# Patient Record
Sex: Female | Born: 1935 | Race: White | Hispanic: No | Marital: Married
Health system: Southern US, Community
[De-identification: ages and names within clinical notes are randomized; demographics above are authoritative.]

---

## 2004-04-18 ENCOUNTER — Ambulatory Visit: Payer: Self-pay | Admitting: Internal Medicine

## 2004-07-22 ENCOUNTER — Ambulatory Visit: Payer: Self-pay | Admitting: Ophthalmology

## 2004-07-29 ENCOUNTER — Ambulatory Visit: Payer: Self-pay | Admitting: Ophthalmology

## 2007-02-26 ENCOUNTER — Inpatient Hospital Stay: Payer: Self-pay | Admitting: Orthopaedic Surgery

## 2007-02-26 ENCOUNTER — Other Ambulatory Visit: Payer: Self-pay

## 2008-03-03 ENCOUNTER — Other Ambulatory Visit: Payer: Self-pay

## 2008-03-03 ENCOUNTER — Inpatient Hospital Stay: Payer: Self-pay | Admitting: Internal Medicine

## 2010-04-17 ENCOUNTER — Inpatient Hospital Stay: Payer: Self-pay | Admitting: Internal Medicine

## 2010-07-24 ENCOUNTER — Ambulatory Visit: Payer: Self-pay | Admitting: Internal Medicine

## 2010-07-30 ENCOUNTER — Emergency Department: Payer: Self-pay | Admitting: Emergency Medicine

## 2011-01-06 ENCOUNTER — Inpatient Hospital Stay: Payer: Self-pay | Admitting: Internal Medicine

## 2011-07-19 ENCOUNTER — Inpatient Hospital Stay: Payer: Self-pay | Admitting: Internal Medicine

## 2011-07-19 LAB — CK TOTAL AND CKMB (NOT AT ARMC)
CK, Total: 52 U/L (ref 21–215)
CK-MB: 1.8 ng/mL (ref 0.5–3.6)
CK-MB: 2.1 ng/mL (ref 0.5–3.6)

## 2011-07-19 LAB — COMPREHENSIVE METABOLIC PANEL
Anion Gap: 11 (ref 7–16)
BUN: 19 mg/dL — ABNORMAL HIGH (ref 7–18)
Calcium, Total: 10 mg/dL (ref 8.5–10.1)
Chloride: 100 mmol/L (ref 98–107)
Co2: 26 mmol/L (ref 21–32)
EGFR (African American): 48 — ABNORMAL LOW
EGFR (Non-African Amer.): 40 — ABNORMAL LOW
Potassium: 4.1 mmol/L (ref 3.5–5.1)
SGOT(AST): 25 U/L (ref 15–37)
SGPT (ALT): 24 U/L
Total Protein: 8 g/dL (ref 6.4–8.2)

## 2011-07-19 LAB — URINALYSIS, COMPLETE
Bacteria: NONE SEEN
Bilirubin,UR: NEGATIVE
Glucose,UR: 150 mg/dL (ref 0–75)
Leukocyte Esterase: NEGATIVE
RBC,UR: 46 /HPF (ref 0–5)
Specific Gravity: 1.016 (ref 1.003–1.030)
Squamous Epithelial: NONE SEEN
WBC UR: 1 /HPF (ref 0–5)

## 2011-07-19 LAB — CBC
HGB: 14 g/dL (ref 12.0–16.0)
MCH: 31.4 pg (ref 26.0–34.0)
MCV: 93 fL (ref 80–100)
Platelet: 305 10*3/uL (ref 150–440)
RBC: 4.47 10*6/uL (ref 3.80–5.20)
WBC: 19.9 10*3/uL — ABNORMAL HIGH (ref 3.6–11.0)

## 2011-07-19 LAB — BILIRUBIN, DIRECT: Bilirubin, Direct: 0.2 mg/dL (ref 0.00–0.20)

## 2011-07-20 LAB — CBC WITH DIFFERENTIAL/PLATELET
Basophil #: 0 10*3/uL (ref 0.0–0.1)
Basophil %: 0.1 %
Eosinophil #: 0 10*3/uL (ref 0.0–0.7)
Eosinophil %: 0 %
HCT: 41.4 % (ref 35.0–47.0)
HGB: 13.9 g/dL (ref 12.0–16.0)
Lymphocyte #: 1.6 10*3/uL (ref 1.0–3.6)
MCH: 31.3 pg (ref 26.0–34.0)
MCV: 93 fL (ref 80–100)
Monocyte #: 0.3 10*3/uL (ref 0.0–0.7)
Neutrophil #: 29.9 10*3/uL — ABNORMAL HIGH (ref 1.4–6.5)
Neutrophil %: 94.1 %
RBC: 4.45 10*6/uL (ref 3.80–5.20)
WBC: 31.8 10*3/uL — ABNORMAL HIGH (ref 3.6–11.0)

## 2011-07-20 LAB — TROPONIN I: Troponin-I: 0.06 ng/mL — ABNORMAL HIGH

## 2011-07-20 LAB — BASIC METABOLIC PANEL
Anion Gap: 10 (ref 7–16)
Calcium, Total: 9.3 mg/dL (ref 8.5–10.1)
Chloride: 101 mmol/L (ref 98–107)
Co2: 26 mmol/L (ref 21–32)
Creatinine: 1.28 mg/dL (ref 0.60–1.30)
Osmolality: 280 (ref 275–301)

## 2011-07-20 LAB — HEPATIC FUNCTION PANEL A (ARMC)
Albumin: 3.3 g/dL — ABNORMAL LOW (ref 3.4–5.0)
Alkaline Phosphatase: 64 U/L (ref 50–136)
Bilirubin,Total: 2.4 mg/dL — ABNORMAL HIGH (ref 0.2–1.0)
SGOT(AST): 31 U/L (ref 15–37)
Total Protein: 6.4 g/dL (ref 6.4–8.2)

## 2011-07-20 LAB — CK-MB: CK-MB: 1.6 ng/mL (ref 0.5–3.6)

## 2011-07-20 LAB — PROTIME-INR: Prothrombin Time: 17 secs — ABNORMAL HIGH (ref 11.5–14.7)

## 2011-07-21 LAB — CBC WITH DIFFERENTIAL/PLATELET
Basophil #: 0 10*3/uL (ref 0.0–0.1)
Eosinophil #: 0 10*3/uL (ref 0.0–0.7)
Lymphocyte #: 2 10*3/uL (ref 1.0–3.6)
MCH: 31.2 pg (ref 26.0–34.0)
MCHC: 32.9 g/dL (ref 32.0–36.0)
MCV: 95 fL (ref 80–100)
Monocyte #: 1.4 10*3/uL — ABNORMAL HIGH (ref 0.0–0.7)
Platelet: 212 10*3/uL (ref 150–440)
RDW: 14 % (ref 11.5–14.5)

## 2011-07-21 LAB — COMPREHENSIVE METABOLIC PANEL
Albumin: 2.5 g/dL — ABNORMAL LOW (ref 3.4–5.0)
Alkaline Phosphatase: 57 U/L (ref 50–136)
BUN: 24 mg/dL — ABNORMAL HIGH (ref 7–18)
Bilirubin,Total: 2 mg/dL — ABNORMAL HIGH (ref 0.2–1.0)
Calcium, Total: 8.7 mg/dL (ref 8.5–10.1)
Creatinine: 1.27 mg/dL (ref 0.60–1.30)
EGFR (Non-African Amer.): 42 — ABNORMAL LOW
Glucose: 108 mg/dL — ABNORMAL HIGH (ref 65–99)
Osmolality: 288 (ref 275–301)
Potassium: 3.4 mmol/L — ABNORMAL LOW (ref 3.5–5.1)
Sodium: 142 mmol/L (ref 136–145)
Total Protein: 6.1 g/dL — ABNORMAL LOW (ref 6.4–8.2)

## 2011-07-21 LAB — APTT: Activated PTT: 56.3 secs — ABNORMAL HIGH (ref 23.6–35.9)

## 2011-07-22 LAB — CBC WITH DIFFERENTIAL/PLATELET
Basophil #: 0 10*3/uL (ref 0.0–0.1)
Eosinophil #: 0.2 10*3/uL (ref 0.0–0.7)
HGB: 10.2 g/dL — ABNORMAL LOW (ref 12.0–16.0)
Lymphocyte #: 2.9 10*3/uL (ref 1.0–3.6)
Lymphocyte %: 20.2 %
MCH: 30.9 pg (ref 26.0–34.0)
MCHC: 32.6 g/dL (ref 32.0–36.0)
Neutrophil %: 71.4 %
Platelet: 215 10*3/uL (ref 150–440)
RDW: 14 % (ref 11.5–14.5)

## 2011-07-22 LAB — BASIC METABOLIC PANEL
Anion Gap: 11 (ref 7–16)
BUN: 23 mg/dL — ABNORMAL HIGH (ref 7–18)
Calcium, Total: 8.3 mg/dL — ABNORMAL LOW (ref 8.5–10.1)
Co2: 26 mmol/L (ref 21–32)
EGFR (African American): 53 — ABNORMAL LOW
EGFR (Non-African Amer.): 43 — ABNORMAL LOW
Glucose: 89 mg/dL (ref 65–99)
Osmolality: 292 (ref 275–301)
Sodium: 145 mmol/L (ref 136–145)

## 2011-07-22 LAB — HEPATIC FUNCTION PANEL A (ARMC)
Albumin: 2.2 g/dL — ABNORMAL LOW (ref 3.4–5.0)
Alkaline Phosphatase: 94 U/L (ref 50–136)
Bilirubin, Direct: 0.7 mg/dL — ABNORMAL HIGH (ref 0.00–0.20)
SGOT(AST): 91 U/L — ABNORMAL HIGH (ref 15–37)
SGPT (ALT): 73 U/L

## 2011-07-22 LAB — PATHOLOGY REPORT

## 2011-07-23 LAB — CBC WITH DIFFERENTIAL/PLATELET
Basophil #: 0 10*3/uL (ref 0.0–0.1)
Eosinophil #: 0.2 10*3/uL (ref 0.0–0.7)
Eosinophil %: 2.1 %
Lymphocyte %: 19.2 %
MCH: 31.1 pg (ref 26.0–34.0)
MCHC: 32.6 g/dL (ref 32.0–36.0)
MCV: 95 fL (ref 80–100)
Monocyte #: 1 10*3/uL — ABNORMAL HIGH (ref 0.0–0.7)
Neutrophil %: 69.3 %
Platelet: 250 10*3/uL (ref 150–440)
RBC: 3.27 10*6/uL — ABNORMAL LOW (ref 3.80–5.20)

## 2011-07-23 LAB — BASIC METABOLIC PANEL
Anion Gap: 8 (ref 7–16)
Chloride: 105 mmol/L (ref 98–107)
EGFR (African American): 49 — ABNORMAL LOW
EGFR (Non-African Amer.): 41 — ABNORMAL LOW
Glucose: 107 mg/dL — ABNORMAL HIGH (ref 65–99)
Osmolality: 284 (ref 275–301)
Potassium: 3.5 mmol/L (ref 3.5–5.1)

## 2011-07-23 LAB — HEPATIC FUNCTION PANEL A (ARMC)
Alkaline Phosphatase: 121 U/L (ref 50–136)
Bilirubin,Total: 1.2 mg/dL — ABNORMAL HIGH (ref 0.2–1.0)
SGOT(AST): 45 U/L — ABNORMAL HIGH (ref 15–37)
SGPT (ALT): 62 U/L

## 2011-07-23 LAB — VANCOMYCIN, TROUGH: Vancomycin, Trough: 15 ug/mL (ref 10–20)

## 2011-07-23 LAB — CULTURE, BLOOD (SINGLE)

## 2011-07-24 LAB — BASIC METABOLIC PANEL
Anion Gap: 12 (ref 7–16)
BUN: 14 mg/dL (ref 7–18)
Chloride: 106 mmol/L (ref 98–107)
Co2: 26 mmol/L (ref 21–32)
Creatinine: 1.09 mg/dL (ref 0.60–1.30)
EGFR (African American): >60
Glucose: 129 mg/dL — ABNORMAL HIGH (ref 65–99)
Osmolality: 289 (ref 275–301)

## 2011-07-24 LAB — CBC WITH DIFFERENTIAL/PLATELET
Basophil %: 0.1 %
HCT: 30.7 % — ABNORMAL LOW (ref 35.0–47.0)
HGB: 10.1 g/dL — ABNORMAL LOW (ref 12.0–16.0)
Lymphocyte %: 17.1 %
MCHC: 32.8 g/dL (ref 32.0–36.0)
Monocyte %: 10.9 %
Neutrophil #: 8 10*3/uL — ABNORMAL HIGH (ref 1.4–6.5)
RBC: 3.26 10*6/uL — ABNORMAL LOW (ref 3.80–5.20)
WBC: 11.1 10*3/uL — ABNORMAL HIGH (ref 3.6–11.0)

## 2011-07-24 LAB — HEPATIC FUNCTION PANEL A (ARMC)
Albumin: 2.7 g/dL — ABNORMAL LOW (ref 3.4–5.0)
Alkaline Phosphatase: 121 U/L (ref 50–136)
Bilirubin, Direct: 0.3 mg/dL — ABNORMAL HIGH (ref 0.00–0.20)
Bilirubin,Total: 1.1 mg/dL — ABNORMAL HIGH (ref 0.2–1.0)
SGOT(AST): 37 U/L (ref 15–37)
SGPT (ALT): 53 U/L

## 2011-07-25 LAB — CULTURE, BLOOD (SINGLE)

## 2011-10-17 LAB — COMPREHENSIVE METABOLIC PANEL
Alkaline Phosphatase: 82 U/L (ref 50–136)
Anion Gap: 6 — ABNORMAL LOW (ref 7–16)
BUN: 20 mg/dL — ABNORMAL HIGH (ref 7–18)
Bilirubin,Total: 0.6 mg/dL (ref 0.2–1.0)
Calcium, Total: 9.1 mg/dL (ref 8.5–10.1)
Chloride: 109 mmol/L — ABNORMAL HIGH (ref 98–107)
Co2: 26 mmol/L (ref 21–32)
EGFR (African American): 59 — ABNORMAL LOW
EGFR (Non-African Amer.): 51 — ABNORMAL LOW
Potassium: 4.5 mmol/L (ref 3.5–5.1)
SGOT(AST): 32 U/L (ref 15–37)
SGPT (ALT): 24 U/L
Sodium: 141 mmol/L (ref 136–145)
Total Protein: 7.2 g/dL (ref 6.4–8.2)

## 2011-10-17 LAB — CBC
HGB: 12.3 g/dL (ref 12.0–16.0)
MCH: 29 pg (ref 26.0–34.0)
MCHC: 31.4 g/dL — ABNORMAL LOW (ref 32.0–36.0)
RDW: 15.1 % — ABNORMAL HIGH (ref 11.5–14.5)

## 2011-10-18 ENCOUNTER — Inpatient Hospital Stay: Payer: Self-pay | Admitting: Orthopedic Surgery

## 2011-10-18 ENCOUNTER — Ambulatory Visit: Payer: Self-pay | Admitting: Orthopaedic Surgery

## 2011-10-18 LAB — URINALYSIS, COMPLETE
Blood: NEGATIVE
Hyaline Cast: 1
Ketone: NEGATIVE
Ph: 5 (ref 4.5–8.0)
Protein: NEGATIVE
Squamous Epithelial: <1

## 2011-10-18 LAB — APTT: Activated PTT: 123.3 secs — ABNORMAL HIGH (ref 23.6–35.9)

## 2011-10-18 LAB — PROTIME-INR: Prothrombin Time: 22.6 secs — ABNORMAL HIGH (ref 11.5–14.7)

## 2011-10-19 LAB — CBC WITH DIFFERENTIAL/PLATELET
Basophil #: 0.1 10*3/uL (ref 0.0–0.1)
Basophil %: 0.4 %
Eosinophil #: 0.4 10*3/uL (ref 0.0–0.7)
HCT: 29.3 % — ABNORMAL LOW (ref 35.0–47.0)
Lymphocyte #: 2.6 10*3/uL (ref 1.0–3.6)
Lymphocyte %: 14.7 %
MCH: 30 pg (ref 26.0–34.0)
MCHC: 32.3 g/dL (ref 32.0–36.0)
MCV: 93 fL (ref 80–100)
Monocyte #: 1.3 x10 3/mm — ABNORMAL HIGH (ref 0.2–0.9)
Neutrophil #: 13.1 10*3/uL — ABNORMAL HIGH (ref 1.4–6.5)
RDW: 14.9 % — ABNORMAL HIGH (ref 11.5–14.5)
WBC: 17.4 10*3/uL — ABNORMAL HIGH (ref 3.6–11.0)

## 2011-10-19 LAB — BASIC METABOLIC PANEL
Calcium, Total: 8.4 mg/dL — ABNORMAL LOW (ref 8.5–10.1)
Chloride: 105 mmol/L (ref 98–107)
Co2: 24 mmol/L (ref 21–32)
EGFR (African American): 38 — ABNORMAL LOW
EGFR (Non-African Amer.): 33 — ABNORMAL LOW
Osmolality: 278 (ref 275–301)

## 2011-10-19 LAB — PROTIME-INR
INR: 1.5
Prothrombin Time: 18.4 secs — ABNORMAL HIGH (ref 11.5–14.7)

## 2011-10-19 LAB — MAGNESIUM: Magnesium: 1.6 mg/dL — ABNORMAL LOW

## 2011-10-19 LAB — APTT: Activated PTT: 68.8 secs — ABNORMAL HIGH (ref 23.6–35.9)

## 2011-10-20 LAB — PROTIME-INR
INR: 1.2
Prothrombin Time: 15.9 secs — ABNORMAL HIGH (ref 11.5–14.7)

## 2011-10-20 LAB — BASIC METABOLIC PANEL
Anion Gap: 11 (ref 7–16)
Chloride: 105 mmol/L (ref 98–107)
Co2: 23 mmol/L (ref 21–32)
Creatinine: 1.11 mg/dL (ref 0.60–1.30)
EGFR (African American): 52 — ABNORMAL LOW
Osmolality: 282 (ref 275–301)
Potassium: 4.3 mmol/L (ref 3.5–5.1)
Sodium: 139 mmol/L (ref 136–145)

## 2011-10-20 LAB — CBC WITH DIFFERENTIAL/PLATELET
Basophil #: 0.1 10*3/uL (ref 0.0–0.1)
Basophil %: 0.4 %
HCT: 26.4 % — ABNORMAL LOW (ref 35.0–47.0)
MCH: 30 pg (ref 26.0–34.0)
MCHC: 32.9 g/dL (ref 32.0–36.0)
Monocyte #: 1.3 x10 3/mm — ABNORMAL HIGH (ref 0.2–0.9)
Monocyte %: 7.8 %

## 2011-10-20 LAB — APTT: Activated PTT: 58.3 secs — ABNORMAL HIGH (ref 23.6–35.9)

## 2011-10-21 LAB — CBC WITH DIFFERENTIAL/PLATELET
Basophil #: 0 10*3/uL (ref 0.0–0.1)
Eosinophil #: 0 10*3/uL (ref 0.0–0.7)
HCT: 26.9 % — ABNORMAL LOW (ref 35.0–47.0)
HGB: 8.9 g/dL — ABNORMAL LOW (ref 12.0–16.0)
Lymphocyte %: 5.4 %
MCHC: 33.1 g/dL (ref 32.0–36.0)
Neutrophil %: 91 %
Platelet: 154 10*3/uL (ref 150–440)
RBC: 2.95 10*6/uL — ABNORMAL LOW (ref 3.80–5.20)
RDW: 14.9 % — ABNORMAL HIGH (ref 11.5–14.5)

## 2011-10-21 LAB — BASIC METABOLIC PANEL
Anion Gap: 8 (ref 7–16)
Calcium, Total: 8.4 mg/dL — ABNORMAL LOW (ref 8.5–10.1)
Chloride: 110 mmol/L — ABNORMAL HIGH (ref 98–107)
Co2: 26 mmol/L (ref 21–32)
Creatinine: 1.02 mg/dL (ref 0.60–1.30)
Osmolality: 294 (ref 275–301)

## 2011-10-21 LAB — PROTIME-INR: INR: 1.1

## 2011-10-22 LAB — CBC WITH DIFFERENTIAL/PLATELET
Basophil #: 0 10*3/uL (ref 0.0–0.1)
Basophil %: 0.1 %
Eosinophil %: 0.1 %
HGB: 8.6 g/dL — ABNORMAL LOW (ref 12.0–16.0)
Lymphocyte #: 1 10*3/uL (ref 1.0–3.6)
Lymphocyte %: 5.5 %
MCHC: 33.1 g/dL (ref 32.0–36.0)
MCV: 90 fL (ref 80–100)
Monocyte %: 3.3 %
Neutrophil #: 17.2 10*3/uL — ABNORMAL HIGH (ref 1.4–6.5)
Platelet: 228 10*3/uL (ref 150–440)
RBC: 2.87 10*6/uL — ABNORMAL LOW (ref 3.80–5.20)
WBC: 18.9 10*3/uL — ABNORMAL HIGH (ref 3.6–11.0)

## 2011-10-22 LAB — BASIC METABOLIC PANEL
Anion Gap: 7 (ref 7–16)
BUN: 42 mg/dL — ABNORMAL HIGH (ref 7–18)
Chloride: 108 mmol/L — ABNORMAL HIGH (ref 98–107)
Co2: 27 mmol/L (ref 21–32)
Creatinine: 1.07 mg/dL (ref 0.60–1.30)
EGFR (African American): 54 — ABNORMAL LOW
EGFR (Non-African Amer.): 47 — ABNORMAL LOW
Glucose: 175 mg/dL — ABNORMAL HIGH (ref 65–99)
Osmolality: 298 (ref 275–301)
Potassium: 3.7 mmol/L (ref 3.5–5.1)
Sodium: 142 mmol/L (ref 136–145)

## 2011-10-22 LAB — PROTIME-INR
INR: 1
Prothrombin Time: 13.2 secs (ref 11.5–14.7)

## 2011-10-22 LAB — APTT: Activated PTT: 43.2 secs — ABNORMAL HIGH (ref 23.6–35.9)

## 2011-10-23 LAB — CBC WITH DIFFERENTIAL/PLATELET
Basophil #: 0 10*3/uL (ref 0.0–0.1)
Basophil %: 0.2 %
Eosinophil #: 0 10*3/uL (ref 0.0–0.7)
HCT: 25.8 % — ABNORMAL LOW (ref 35.0–47.0)
Lymphocyte %: 5.4 %
MCV: 92 fL (ref 80–100)
Neutrophil #: 15.3 10*3/uL — ABNORMAL HIGH (ref 1.4–6.5)
Platelet: 249 10*3/uL (ref 150–440)
RBC: 2.81 10*6/uL — ABNORMAL LOW (ref 3.80–5.20)
WBC: 17 10*3/uL — ABNORMAL HIGH (ref 3.6–11.0)

## 2011-10-23 LAB — BASIC METABOLIC PANEL
Anion Gap: 8 (ref 7–16)
Calcium, Total: 8.2 mg/dL — ABNORMAL LOW (ref 8.5–10.1)
Chloride: 110 mmol/L — ABNORMAL HIGH (ref 98–107)
Creatinine: 0.97 mg/dL (ref 0.60–1.30)
EGFR (African American): >60
EGFR (Non-African Amer.): 53 — ABNORMAL LOW
Glucose: 129 mg/dL — ABNORMAL HIGH (ref 65–99)
Osmolality: 298 (ref 275–301)
Potassium: 4.3 mmol/L (ref 3.5–5.1)

## 2011-10-23 LAB — HEMOGLOBIN: HGB: 7.8 g/dL — ABNORMAL LOW (ref 12.0–16.0)

## 2011-10-23 LAB — PROTIME-INR
INR: 1.1
Prothrombin Time: 14.5 secs (ref 11.5–14.7)

## 2011-10-23 LAB — APTT: Activated PTT: 33.7 secs (ref 23.6–35.9)

## 2011-10-24 LAB — URINALYSIS, COMPLETE
Glucose,UR: 50 mg/dL (ref 0–75)
Ph: 5 (ref 4.5–8.0)
RBC,UR: 1371 /HPF (ref 0–5)
Specific Gravity: 1.026 (ref 1.003–1.030)
Transitional Epi: 2
WBC UR: 365 /HPF (ref 0–5)

## 2011-10-24 LAB — BASIC METABOLIC PANEL
Anion Gap: 5 — ABNORMAL LOW (ref 7–16)
BUN: 28 mg/dL — ABNORMAL HIGH (ref 7–18)
Calcium, Total: 7.3 mg/dL — ABNORMAL LOW (ref 8.5–10.1)
Co2: 26 mmol/L (ref 21–32)
Creatinine: 0.91 mg/dL (ref 0.60–1.30)
EGFR (African American): >60
EGFR (Non-African Amer.): 57 — ABNORMAL LOW
Glucose: 127 mg/dL — ABNORMAL HIGH (ref 65–99)
Osmolality: 294 (ref 275–301)
Potassium: 3.8 mmol/L (ref 3.5–5.1)

## 2011-10-24 LAB — CBC WITH DIFFERENTIAL/PLATELET
Basophil %: 0.1 %
Eosinophil #: 0.1 10*3/uL (ref 0.0–0.7)
Eosinophil %: 0.6 %
HCT: 30.1 % — ABNORMAL LOW (ref 35.0–47.0)
HGB: 9.9 g/dL — ABNORMAL LOW (ref 12.0–16.0)
Lymphocyte #: 1.4 10*3/uL (ref 1.0–3.6)
MCH: 30.3 pg (ref 26.0–34.0)
Platelet: 247 10*3/uL (ref 150–440)
RBC: 3.26 10*6/uL — ABNORMAL LOW (ref 3.80–5.20)
RDW: 15 % — ABNORMAL HIGH (ref 11.5–14.5)
WBC: 12.8 10*3/uL — ABNORMAL HIGH (ref 3.6–11.0)

## 2011-10-25 LAB — CBC WITH DIFFERENTIAL/PLATELET
Eosinophil #: 0.5 10*3/uL (ref 0.0–0.7)
HCT: 29.3 % — ABNORMAL LOW (ref 35.0–47.0)
Lymphocyte #: 1.9 10*3/uL (ref 1.0–3.6)
Lymphocyte %: 11.4 %
MCH: 29.8 pg (ref 26.0–34.0)
MCHC: 31.7 g/dL — ABNORMAL LOW (ref 32.0–36.0)
MCV: 94 fL (ref 80–100)
Monocyte #: 1.1 x10 3/mm — ABNORMAL HIGH (ref 0.2–0.9)
Monocyte %: 6.4 %
Neutrophil #: 13.1 10*3/uL — ABNORMAL HIGH (ref 1.4–6.5)
RBC: 3.11 10*6/uL — ABNORMAL LOW (ref 3.80–5.20)
RDW: 14.6 % — ABNORMAL HIGH (ref 11.5–14.5)
WBC: 16.5 10*3/uL — ABNORMAL HIGH (ref 3.6–11.0)

## 2011-10-26 LAB — CBC WITH DIFFERENTIAL/PLATELET
Eosinophil #: 0.4 10*3/uL (ref 0.0–0.7)
Eosinophil %: 2.5 %
HCT: 29.3 % — ABNORMAL LOW (ref 35.0–47.0)
Lymphocyte #: 1.4 10*3/uL (ref 1.0–3.6)
Neutrophil #: 13.9 10*3/uL — ABNORMAL HIGH (ref 1.4–6.5)
Neutrophil %: 82.9 %
Platelet: 299 10*3/uL (ref 150–440)
RBC: 3.16 10*6/uL — ABNORMAL LOW (ref 3.80–5.20)
RDW: 15.1 % — ABNORMAL HIGH (ref 11.5–14.5)
WBC: 16.8 10*3/uL — ABNORMAL HIGH (ref 3.6–11.0)

## 2011-10-26 LAB — BASIC METABOLIC PANEL
Anion Gap: 7 (ref 7–16)
Chloride: 107 mmol/L (ref 98–107)
Co2: 26 mmol/L (ref 21–32)
Creatinine: 0.92 mg/dL (ref 0.60–1.30)
EGFR (African American): >60
EGFR (Non-African Amer.): 56 — ABNORMAL LOW
Osmolality: 285 (ref 275–301)
Potassium: 4.3 mmol/L (ref 3.5–5.1)
Sodium: 140 mmol/L (ref 136–145)

## 2011-10-27 LAB — BASIC METABOLIC PANEL
BUN: 23 mg/dL — ABNORMAL HIGH (ref 7–18)
Chloride: 108 mmol/L — ABNORMAL HIGH (ref 98–107)
Co2: 24 mmol/L (ref 21–32)
Creatinine: 0.87 mg/dL (ref 0.60–1.30)
EGFR (African American): >60
EGFR (Non-African Amer.): 60 — ABNORMAL LOW
Glucose: 112 mg/dL — ABNORMAL HIGH (ref 65–99)
Osmolality: 286 (ref 275–301)
Potassium: 5.2 mmol/L — ABNORMAL HIGH (ref 3.5–5.1)
Sodium: 141 mmol/L (ref 136–145)

## 2011-10-27 LAB — CBC WITH DIFFERENTIAL/PLATELET
Basophil #: 0.1 10*3/uL (ref 0.0–0.1)
Basophil %: 0.7 %
HCT: 28.9 % — ABNORMAL LOW (ref 35.0–47.0)
HGB: 9.6 g/dL — ABNORMAL LOW (ref 12.0–16.0)
MCH: 31.2 pg (ref 26.0–34.0)
MCV: 94 fL (ref 80–100)
Monocyte #: 1.3 x10 3/mm — ABNORMAL HIGH (ref 0.2–0.9)
Neutrophil #: 13.1 10*3/uL — ABNORMAL HIGH (ref 1.4–6.5)
Platelet: 329 10*3/uL (ref 150–440)
RBC: 3.08 10*6/uL — ABNORMAL LOW (ref 3.80–5.20)
RDW: 15.5 % — ABNORMAL HIGH (ref 11.5–14.5)
WBC: 16.3 10*3/uL — ABNORMAL HIGH (ref 3.6–11.0)

## 2011-10-27 LAB — URINE CULTURE

## 2011-10-28 LAB — PATHOLOGY REPORT

## 2011-10-30 ENCOUNTER — Inpatient Hospital Stay: Payer: Self-pay | Admitting: Internal Medicine

## 2011-10-30 LAB — CBC
HCT: 31 % — ABNORMAL LOW (ref 35.0–47.0)
HGB: 10 g/dL — ABNORMAL LOW (ref 12.0–16.0)
MCH: 30.2 pg (ref 26.0–34.0)
MCHC: 32.3 g/dL (ref 32.0–36.0)
Platelet: 581 10*3/uL — ABNORMAL HIGH (ref 150–440)
RBC: 3.31 10*6/uL — ABNORMAL LOW (ref 3.80–5.20)
RDW: 15.8 % — ABNORMAL HIGH (ref 11.5–14.5)

## 2011-10-30 LAB — URINALYSIS, COMPLETE
Bilirubin,UR: NEGATIVE
Blood: NEGATIVE
Ketone: NEGATIVE
Nitrite: NEGATIVE
Squamous Epithelial: NONE SEEN
WBC UR: <1 /HPF (ref 0–5)

## 2011-10-30 LAB — COMPREHENSIVE METABOLIC PANEL
Alkaline Phosphatase: 175 U/L — ABNORMAL HIGH (ref 50–136)
BUN: 36 mg/dL — ABNORMAL HIGH (ref 7–18)
Co2: 29 mmol/L (ref 21–32)
EGFR (African American): 52 — ABNORMAL LOW
Osmolality: 285 (ref 275–301)
Potassium: 4.3 mmol/L (ref 3.5–5.1)
SGPT (ALT): 38 U/L
Sodium: 138 mmol/L (ref 136–145)
Total Protein: 6.5 g/dL (ref 6.4–8.2)

## 2011-10-30 LAB — TROPONIN I: Troponin-I: <0.02 ng/mL

## 2011-10-31 LAB — BASIC METABOLIC PANEL
Anion Gap: 9 (ref 7–16)
BUN: 31 mg/dL — ABNORMAL HIGH (ref 7–18)
Co2: 29 mmol/L (ref 21–32)
Creatinine: 1.06 mg/dL (ref 0.60–1.30)
EGFR (African American): 55 — ABNORMAL LOW
Osmolality: 285 (ref 275–301)
Potassium: 3.5 mmol/L (ref 3.5–5.1)
Sodium: 140 mmol/L (ref 136–145)

## 2011-10-31 LAB — CBC WITH DIFFERENTIAL/PLATELET
Eosinophil #: 0 10*3/uL (ref 0.0–0.7)
Eosinophil %: 0.1 %
HCT: 31 % — ABNORMAL LOW (ref 35.0–47.0)
HGB: 9.9 g/dL — ABNORMAL LOW (ref 12.0–16.0)
MCHC: 32 g/dL (ref 32.0–36.0)
Neutrophil #: 19.3 10*3/uL — ABNORMAL HIGH (ref 1.4–6.5)
Neutrophil %: 84.5 %
RBC: 3.3 10*6/uL — ABNORMAL LOW (ref 3.80–5.20)
WBC: 22.9 10*3/uL — ABNORMAL HIGH (ref 3.6–11.0)

## 2011-10-31 LAB — MAGNESIUM: Magnesium: 1.9 mg/dL

## 2011-11-01 LAB — CBC WITH DIFFERENTIAL/PLATELET
Basophil %: 0.7 %
Eosinophil #: 0.1 10*3/uL (ref 0.0–0.7)
Eosinophil %: 0.3 %
HGB: 10 g/dL — ABNORMAL LOW (ref 12.0–16.0)
Lymphocyte %: 8.4 %
MCH: 30.1 pg (ref 26.0–34.0)
MCV: 93 fL (ref 80–100)
Monocyte #: 1.2 x10 3/mm — ABNORMAL HIGH (ref 0.2–0.9)
Monocyte %: 5.9 %
Neutrophil #: 16.7 10*3/uL — ABNORMAL HIGH (ref 1.4–6.5)
Neutrophil %: 84.7 %
Platelet: 462 10*3/uL — ABNORMAL HIGH (ref 150–440)
RBC: 3.31 10*6/uL — ABNORMAL LOW (ref 3.80–5.20)

## 2011-11-01 LAB — BASIC METABOLIC PANEL
Anion Gap: 10 (ref 7–16)
Calcium, Total: 8.2 mg/dL — ABNORMAL LOW (ref 8.5–10.1)
Glucose: 118 mg/dL — ABNORMAL HIGH (ref 65–99)
Osmolality: 290 (ref 275–301)
Sodium: 142 mmol/L (ref 136–145)

## 2011-11-02 LAB — CBC WITH DIFFERENTIAL/PLATELET
Basophil %: 0.1 %
Eosinophil #: 0 10*3/uL (ref 0.0–0.7)
HCT: 31.7 % — ABNORMAL LOW (ref 35.0–47.0)
MCH: 30.6 pg (ref 26.0–34.0)
MCHC: 32.5 g/dL (ref 32.0–36.0)
Neutrophil #: 17.6 10*3/uL — ABNORMAL HIGH (ref 1.4–6.5)
Neutrophil %: 92.9 %
Platelet: 436 10*3/uL (ref 150–440)
RBC: 3.36 10*6/uL — ABNORMAL LOW (ref 3.80–5.20)

## 2011-11-02 LAB — BASIC METABOLIC PANEL
Anion Gap: 11 (ref 7–16)
BUN: 33 mg/dL — ABNORMAL HIGH (ref 7–18)
Co2: 28 mmol/L (ref 21–32)
EGFR (African American): >60
EGFR (Non-African Amer.): 55 — ABNORMAL LOW
Glucose: 107 mg/dL — ABNORMAL HIGH (ref 65–99)
Osmolality: 289 (ref 275–301)
Sodium: 141 mmol/L (ref 136–145)

## 2011-11-03 LAB — CBC WITH DIFFERENTIAL/PLATELET
Basophil #: 0.1 10*3/uL (ref 0.0–0.1)
Eosinophil %: 0 %
Lymphocyte #: 0.9 10*3/uL — ABNORMAL LOW (ref 1.0–3.6)
Lymphocyte %: 4.4 %
MCH: 30.6 pg (ref 26.0–34.0)
Monocyte %: 2.6 %
Neutrophil #: 19.3 10*3/uL — ABNORMAL HIGH (ref 1.4–6.5)
Neutrophil %: 92.4 %
RBC: 3.6 10*6/uL — ABNORMAL LOW (ref 3.80–5.20)
RDW: 16.1 % — ABNORMAL HIGH (ref 11.5–14.5)
WBC: 20.9 10*3/uL — ABNORMAL HIGH (ref 3.6–11.0)

## 2011-11-03 LAB — BASIC METABOLIC PANEL
Anion Gap: 9 (ref 7–16)
BUN: 38 mg/dL — ABNORMAL HIGH (ref 7–18)
Calcium, Total: 8.9 mg/dL (ref 8.5–10.1)
Creatinine: 1.09 mg/dL (ref 0.60–1.30)
EGFR (African American): 53 — ABNORMAL LOW
EGFR (Non-African Amer.): 46 — ABNORMAL LOW
Osmolality: 289 (ref 275–301)
Sodium: 139 mmol/L (ref 136–145)

## 2011-11-04 LAB — CBC WITH DIFFERENTIAL/PLATELET
Basophil #: 0 10*3/uL (ref 0.0–0.1)
Eosinophil #: 0 10*3/uL (ref 0.0–0.7)
Eosinophil %: 0 %
Lymphocyte %: 3.8 %
MCH: 30.6 pg (ref 26.0–34.0)
MCHC: 33.1 g/dL (ref 32.0–36.0)
MCV: 92 fL (ref 80–100)
Monocyte #: 0.5 x10 3/mm (ref 0.2–0.9)
Monocyte %: 2.5 %
Neutrophil %: 93.6 %
RBC: 3.75 10*6/uL — ABNORMAL LOW (ref 3.80–5.20)
RDW: 16.1 % — ABNORMAL HIGH (ref 11.5–14.5)
WBC: 19.9 10*3/uL — ABNORMAL HIGH (ref 3.6–11.0)

## 2011-11-04 LAB — BASIC METABOLIC PANEL
Anion Gap: 11 (ref 7–16)
BUN: 43 mg/dL — ABNORMAL HIGH (ref 7–18)
Calcium, Total: 8.5 mg/dL (ref 8.5–10.1)
Co2: 30 mmol/L (ref 21–32)
EGFR (African American): 45 — ABNORMAL LOW
Glucose: 172 mg/dL — ABNORMAL HIGH (ref 65–99)
Potassium: 3.5 mmol/L (ref 3.5–5.1)
Sodium: 137 mmol/L (ref 136–145)

## 2011-11-05 ENCOUNTER — Encounter: Payer: Self-pay | Admitting: Internal Medicine

## 2011-11-05 LAB — BASIC METABOLIC PANEL
Anion Gap: 8 (ref 7–16)
BUN: 40 mg/dL — ABNORMAL HIGH (ref 7–18)
Calcium, Total: 8.5 mg/dL (ref 8.5–10.1)
Chloride: 97 mmol/L — ABNORMAL LOW (ref 98–107)
Co2: 34 mmol/L — ABNORMAL HIGH (ref 21–32)
Creatinine: 1.13 mg/dL (ref 0.60–1.30)
EGFR (African American): 51 — ABNORMAL LOW
EGFR (Non-African Amer.): 44 — ABNORMAL LOW
Glucose: 175 mg/dL — ABNORMAL HIGH (ref 65–99)
Osmolality: 292 (ref 275–301)
Sodium: 139 mmol/L (ref 136–145)

## 2011-11-05 LAB — CBC WITH DIFFERENTIAL/PLATELET
Basophil #: 0 10*3/uL (ref 0.0–0.1)
Eosinophil #: 0 10*3/uL (ref 0.0–0.7)
Eosinophil %: 0 %
MCH: 31.1 pg (ref 26.0–34.0)
MCHC: 33.5 g/dL (ref 32.0–36.0)
MCV: 93 fL (ref 80–100)
Monocyte #: 0.4 x10 3/mm (ref 0.2–0.9)
Neutrophil %: 92.3 %
Platelet: 448 10*3/uL — ABNORMAL HIGH (ref 150–440)
RDW: 16 % — ABNORMAL HIGH (ref 11.5–14.5)

## 2011-11-05 LAB — CULTURE, BLOOD (SINGLE)

## 2011-11-07 LAB — CREATININE, SERUM
EGFR (African American): 57 — ABNORMAL LOW
EGFR (Non-African Amer.): 49 — ABNORMAL LOW

## 2011-11-14 LAB — CBC WITH DIFFERENTIAL/PLATELET
Basophil #: 0 10*3/uL (ref 0.0–0.1)
Basophil %: 0.1 %
Eosinophil %: 1.5 %
Eosinophil %: 0.3 %
HCT: 32 % — ABNORMAL LOW (ref 35.0–47.0)
HGB: 9.7 g/dL — ABNORMAL LOW (ref 12.0–16.0)
Lymphocyte #: 0.6 10*3/uL — ABNORMAL LOW (ref 1.0–3.6)
Lymphocyte %: 5.1 %
Lymphocyte %: 4.1 %
MCH: 31.2 pg (ref 26.0–34.0)
MCV: 97 fL (ref 80–100)
Monocyte #: 0.6 x10 3/mm (ref 0.2–0.9)
Monocyte #: 0.4 x10 3/mm (ref 0.2–0.9)
Monocyte %: 4.7 %
Monocyte %: 2.7 %
Neutrophil #: 10.9 10*3/uL — ABNORMAL HIGH (ref 1.4–6.5)
Neutrophil #: 12.7 10*3/uL — ABNORMAL HIGH (ref 1.4–6.5)
Neutrophil %: 87.9 %
RDW: 17.5 % — ABNORMAL HIGH (ref 11.5–14.5)
RDW: 18.1 % — ABNORMAL HIGH (ref 11.5–14.5)
WBC: 12.4 10*3/uL — ABNORMAL HIGH (ref 3.6–11.0)
WBC: 13.7 10*3/uL — ABNORMAL HIGH (ref 3.6–11.0)

## 2011-11-14 LAB — BASIC METABOLIC PANEL
Anion Gap: 7 (ref 7–16)
BUN: 26 mg/dL — ABNORMAL HIGH (ref 7–18)
Calcium, Total: 7.4 mg/dL — ABNORMAL LOW (ref 8.5–10.1)
Calcium, Total: 8.8 mg/dL (ref 8.5–10.1)
Chloride: 87 mmol/L — ABNORMAL LOW (ref 98–107)
Chloride: 98 mmol/L (ref 98–107)
Co2: 32 mmol/L (ref 21–32)
Creatinine: 1.53 mg/dL — ABNORMAL HIGH (ref 0.60–1.30)
Creatinine: 1.4 mg/dL — ABNORMAL HIGH (ref 0.60–1.30)
EGFR (African American): 39 — ABNORMAL LOW
EGFR (Non-African Amer.): 30 — ABNORMAL LOW
EGFR (Non-African Amer.): 34 — ABNORMAL LOW
Glucose: 727 mg/dL (ref 65–99)
Osmolality: 292 (ref 275–301)
Potassium: 2.7 mmol/L — ABNORMAL LOW (ref 3.5–5.1)
Potassium: 3.1 mmol/L — ABNORMAL LOW (ref 3.5–5.1)
Sodium: 126 mmol/L — ABNORMAL LOW (ref 136–145)
Sodium: 143 mmol/L (ref 136–145)

## 2011-11-15 ENCOUNTER — Encounter: Payer: Self-pay | Admitting: Internal Medicine

## 2011-11-20 LAB — BASIC METABOLIC PANEL
BUN: 17 mg/dL (ref 7–18)
Calcium, Total: 8.6 mg/dL (ref 8.5–10.1)
Chloride: 107 mmol/L (ref 98–107)
Co2: 30 mmol/L (ref 21–32)
Creatinine: 0.88 mg/dL (ref 0.60–1.30)
EGFR (African American): >60
EGFR (Non-African Amer.): 59 — ABNORMAL LOW
Osmolality: 288 (ref 275–301)
Potassium: 3.9 mmol/L (ref 3.5–5.1)
Sodium: 144 mmol/L (ref 136–145)

## 2011-11-20 LAB — CBC WITH DIFFERENTIAL/PLATELET
Basophil %: 1.1 %
Eosinophil %: 3.9 %
HCT: 33.9 % — ABNORMAL LOW (ref 35.0–47.0)
HGB: 11.1 g/dL — ABNORMAL LOW (ref 12.0–16.0)
Lymphocyte #: 1.1 10*3/uL (ref 1.0–3.6)
MCHC: 32.6 g/dL (ref 32.0–36.0)
Monocyte %: 7.7 %
Neutrophil #: 4.5 10*3/uL (ref 1.4–6.5)
Neutrophil %: 69.9 %
Platelet: 111 10*3/uL — ABNORMAL LOW (ref 150–440)
RBC: 3.55 10*6/uL — ABNORMAL LOW (ref 3.80–5.20)
WBC: 6.4 10*3/uL (ref 3.6–11.0)

## 2011-12-03 LAB — URINALYSIS, COMPLETE
Blood: NEGATIVE
Hyaline Cast: 10
Leukocyte Esterase: NEGATIVE
Protein: NEGATIVE
RBC,UR: 1 /HPF (ref 0–5)
Specific Gravity: 1.017 (ref 1.003–1.030)
Squamous Epithelial: NONE SEEN

## 2011-12-05 LAB — URINE CULTURE

## 2011-12-09 ENCOUNTER — Emergency Department: Payer: Self-pay | Admitting: Emergency Medicine

## 2011-12-09 LAB — CBC WITH DIFFERENTIAL/PLATELET
Basophil #: 0.1 10*3/uL (ref 0.0–0.1)
Basophil %: 1 %
Eosinophil #: 0.2 10*3/uL (ref 0.0–0.7)
Eosinophil %: 1.6 %
HGB: 13 g/dL (ref 12.0–16.0)
Lymphocyte #: 4.5 10*3/uL — ABNORMAL HIGH (ref 1.0–3.6)
Lymphocyte %: 40.8 %
MCV: 94 fL (ref 80–100)
Monocyte #: 1.5 x10 3/mm — ABNORMAL HIGH (ref 0.2–0.9)
Monocyte %: 14 %
Neutrophil %: 42.6 %
Platelet: 503 10*3/uL — ABNORMAL HIGH (ref 150–440)
RBC: 4.3 10*6/uL (ref 3.80–5.20)

## 2011-12-09 LAB — COMPREHENSIVE METABOLIC PANEL
Albumin: 2.5 g/dL — ABNORMAL LOW (ref 3.4–5.0)
Alkaline Phosphatase: 172 U/L — ABNORMAL HIGH (ref 50–136)
Anion Gap: 9 (ref 7–16)
BUN: 16 mg/dL (ref 7–18)
Bilirubin,Total: 0.8 mg/dL (ref 0.2–1.0)
Co2: 27 mmol/L (ref 21–32)
EGFR (African American): >60
Glucose: 80 mg/dL (ref 65–99)
Osmolality: 278 (ref 275–301)
Sodium: 139 mmol/L (ref 136–145)

## 2011-12-09 LAB — TSH: Thyroid Stimulating Horm: 5.93 u[IU]/mL — ABNORMAL HIGH

## 2014-03-31 IMAGING — CR DG CHEST 1V PORT
1 series · 1 of 1 positions shown · non-contrast
Comparison: none

REASON FOR EXAM: sob
COMMENTS:

[portable]
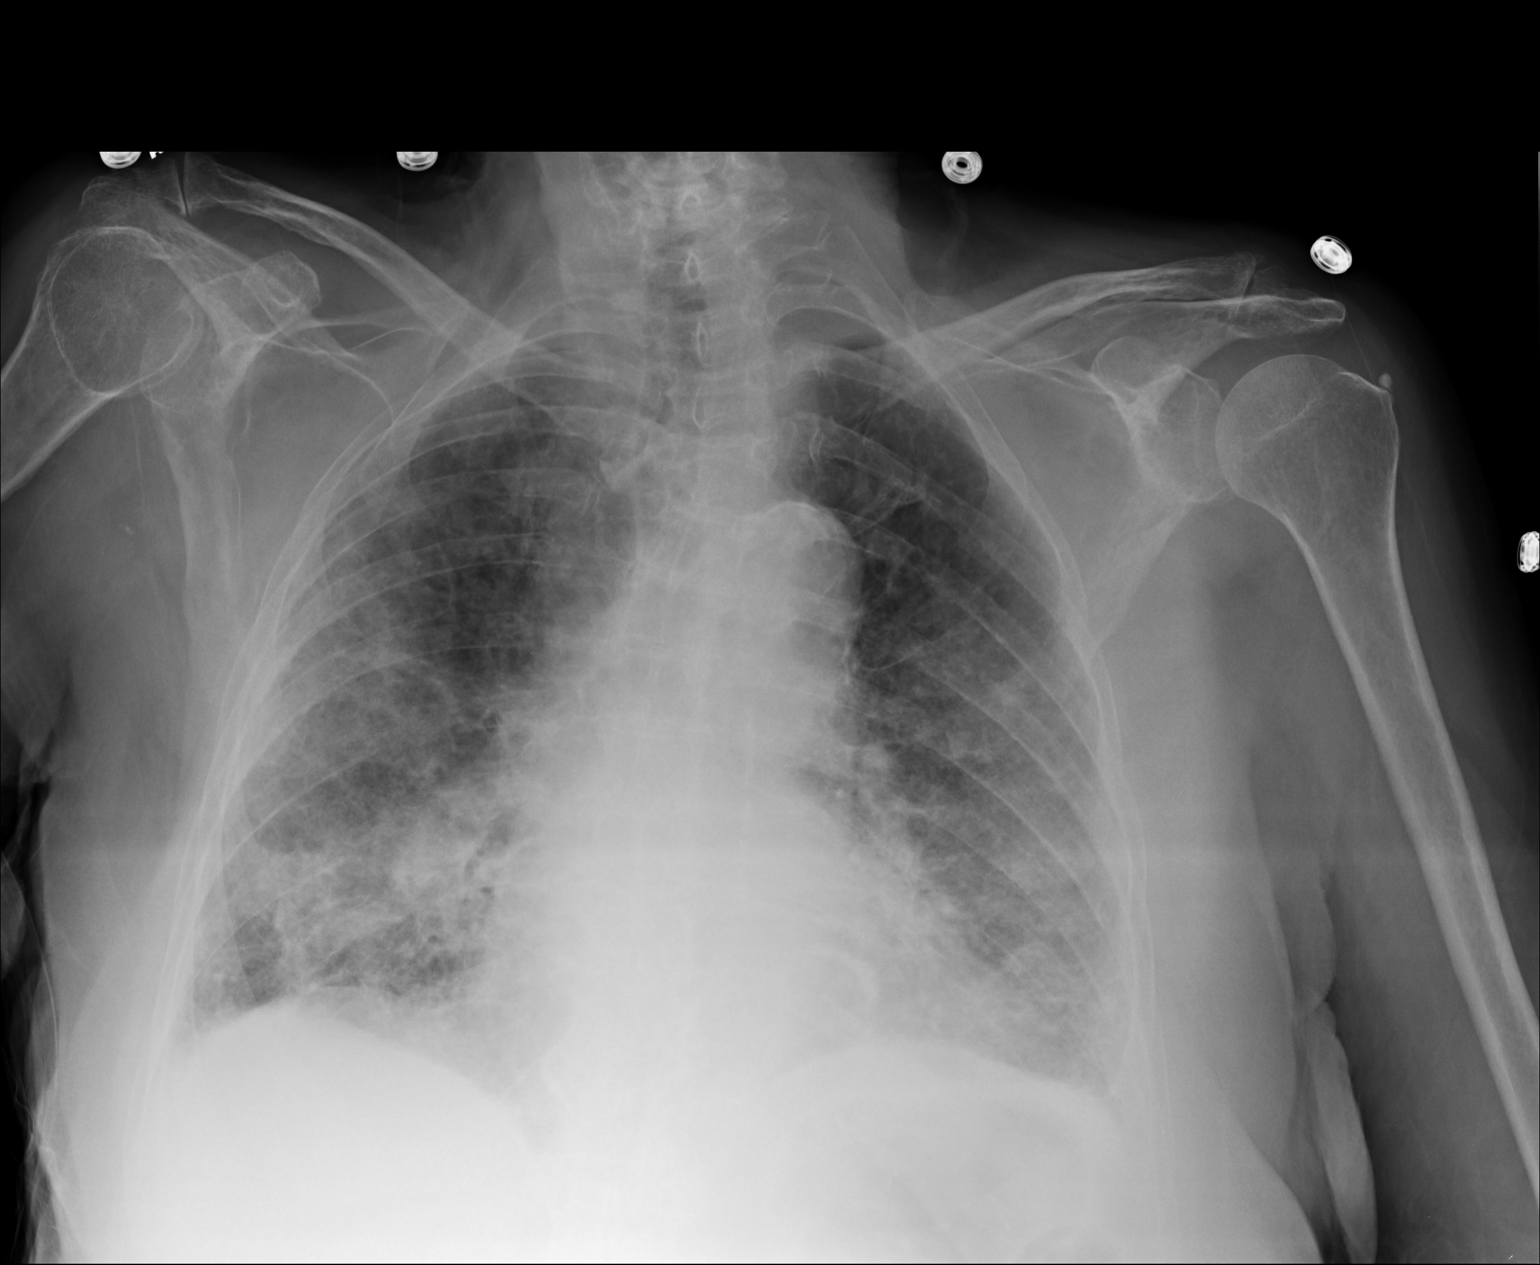

[1 of 1 positions shown; findings below may reference images not displayed]

PROCEDURE:     DXR - DXR PORTABLE CHEST SINGLE VIEW  - October 27, 2011  [DATE]

RESULT:     There is slight prominence of the pulmonary vascularity
consistent with minimal pulmonary vascular congestion or possibly an
elevated state of hydration. Also noted is increased density at the lung
bases bilaterally, more prominent on the right and most compatible with
atelectasis. Heart size is normal. No pleural effusion is seen. No frank
pulmonary edema is identified.
IMPRESSION: 1. Probable minimal pulmonary vascular congestion.
2. There is a mild increase in density at the lung bases, more prominent on
the right and consistent with atelectasis.

## 2014-04-04 IMAGING — NM NM LUNG SCAN
2 series · 16 of 16 positions shown · non-contrast
Comparison: none

REASON FOR EXAM: sob, hypoxia post op
COMMENTS:

[Series 1000: lung perfusion · 1.95mm/px · 4 acquisitions, 8 frames shown]
[im 1/4]
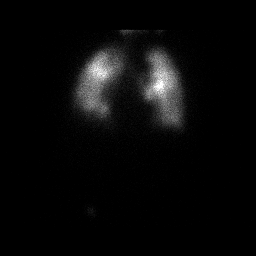
[im 1/4]
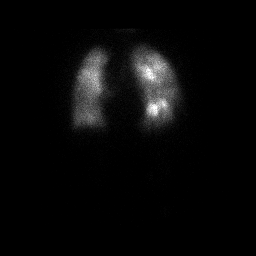
[im 2/4]
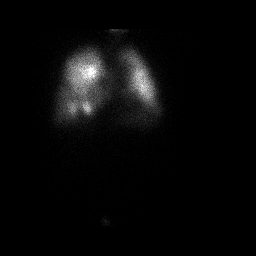
[im 2/4]
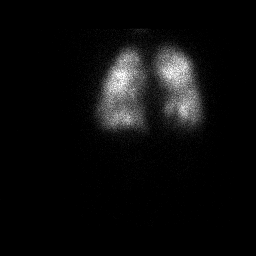
[im 3/4]
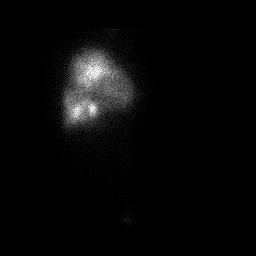
[im 3/4]
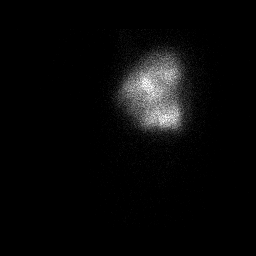
[im 4/4]
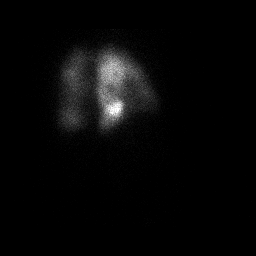
[im 4/4]
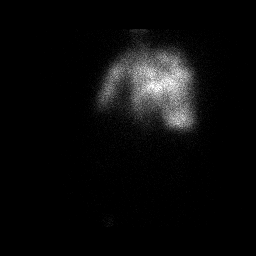

[Series 1000: lung ventilation · 3.90mm/px · 4 acquisitions, 8 frames shown]
[im 1/4]
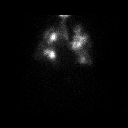
[im 1/4]
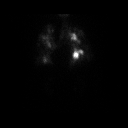
[im 2/4]
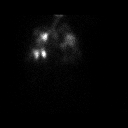
[im 2/4]
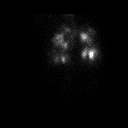
[im 3/4]
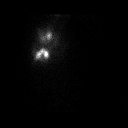
[im 3/4]
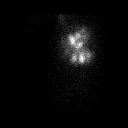
[im 4/4]
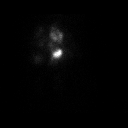
[im 4/4]
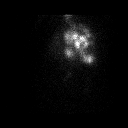

[16 of 16 positions shown; findings below may reference images not displayed]

PROCEDURE:     NM  - NM VQ LUNG SCAN  - [DATE] [DATE] [DATE] [DATE]

RESULT:     Following aerosol administration of 40.41 mCi technetium 99m
DTPA, Ventilation Lung Scan was performed in eight views. After the
ventilation scan and following intravenous administration of 4.32 mCi
technetium 99m macro-aggregated Albumen, Perfusion Lung Scan was performed
in eight views. This examination is somewhat limited due to the diffusely
abnormal chest radiograph. There is observed a heterogeneous distribution of
tracer activity in both lungs on the ventilation scan. Heterogeneous tracer
activity is also seen on the perfusion study. There are no definitely
mismatched perfusion defects identified. Even though this exam is considered
limited, there are identified no definite findings indicative of pulmonary
embolus and the study overall is considered to be low probability. The
current lung scan is compared to the prior exam of 04/18/2010. Both the
ventilation and perfusion scans are dissimilar which is likely related to
the pulmonary edema or pulmonary infiltrative changes now evident on the
chest radiograph.
IMPRESSION: This examination is somewhat limited but overall is
considered low probability for pulmonary embolus.

[REDACTED]

## 2014-04-04 IMAGING — US US EXTREM LOW VENOUS BILAT
1 series · 14 of 24 positions shown · non-contrast
Comparison: none

REASON FOR EXAM: eval  for dvt
COMMENTS:

[Series 1: us extrem low venous bilat · 0.09mm/px · 14 of 46 slices shown]
[im 1/46]
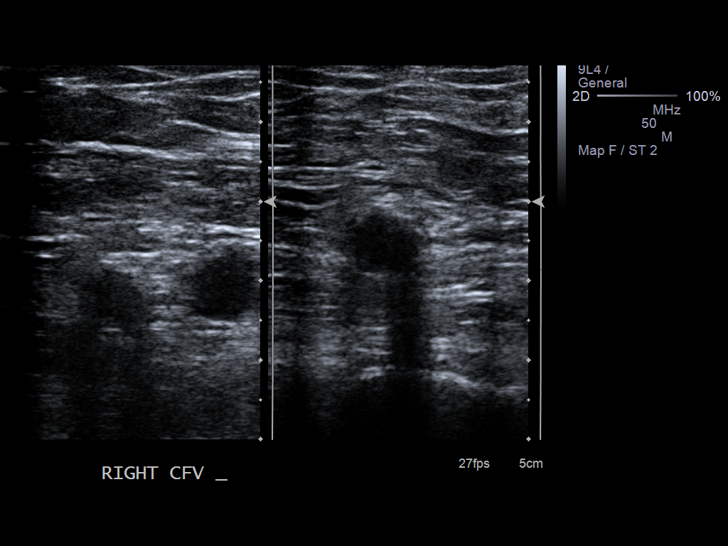
[im 4/46]
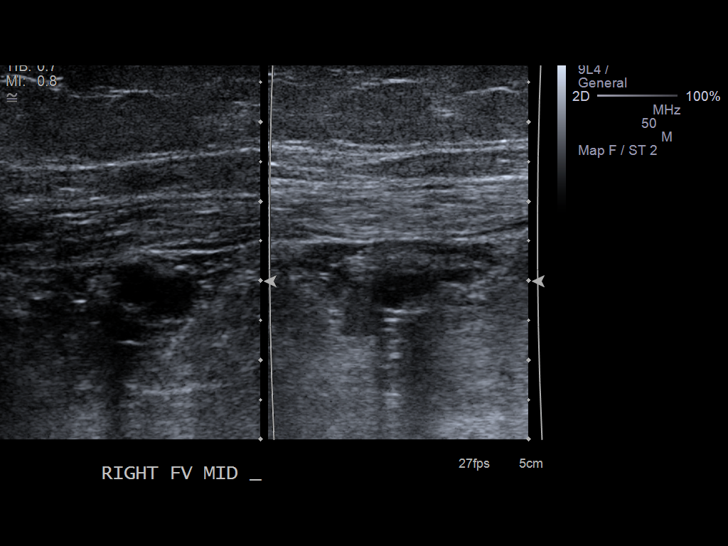
[im 8/46]
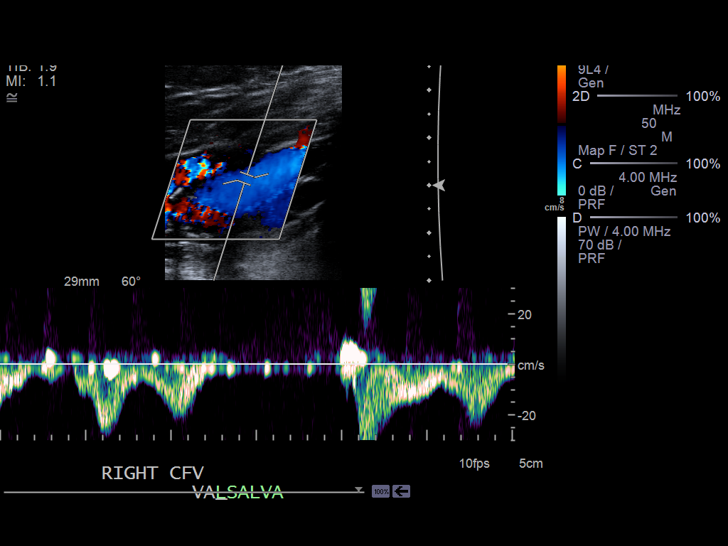
[im 12/46]
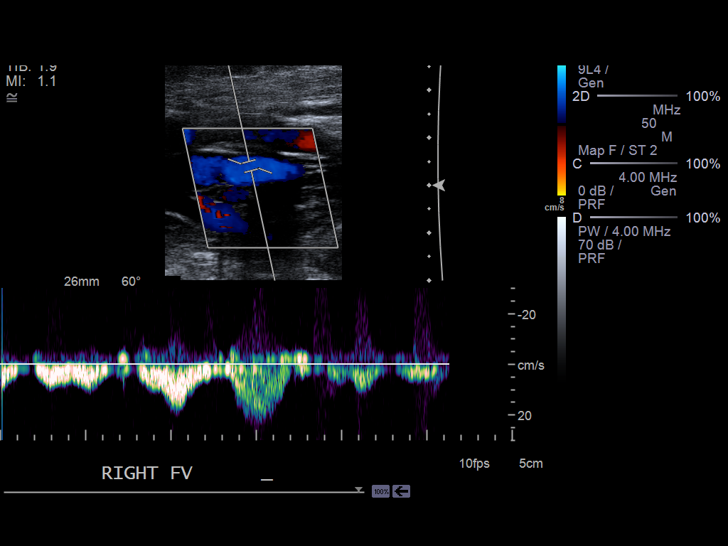
[im 14/46]
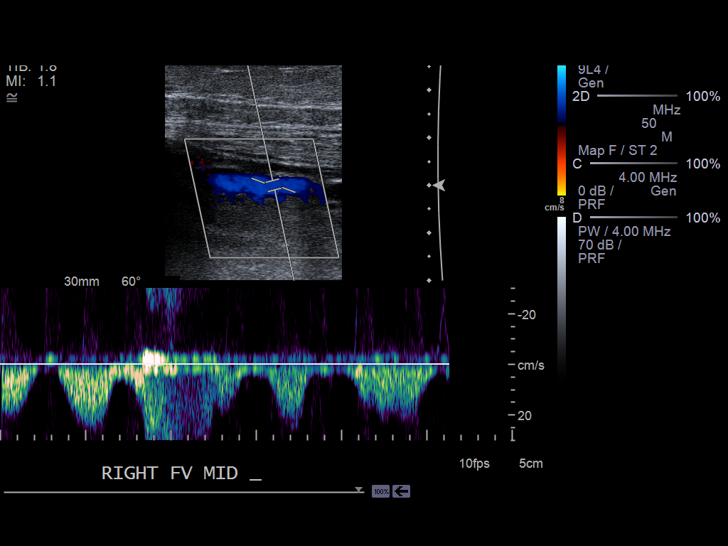
[im 18/46]
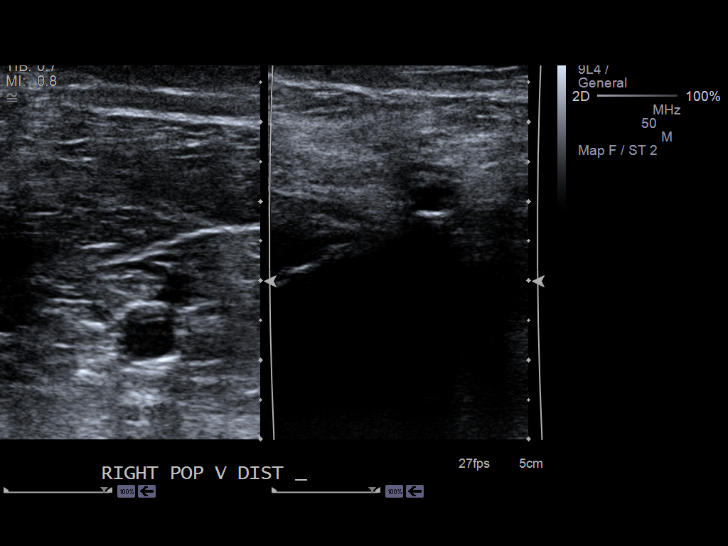
[im 22/46]
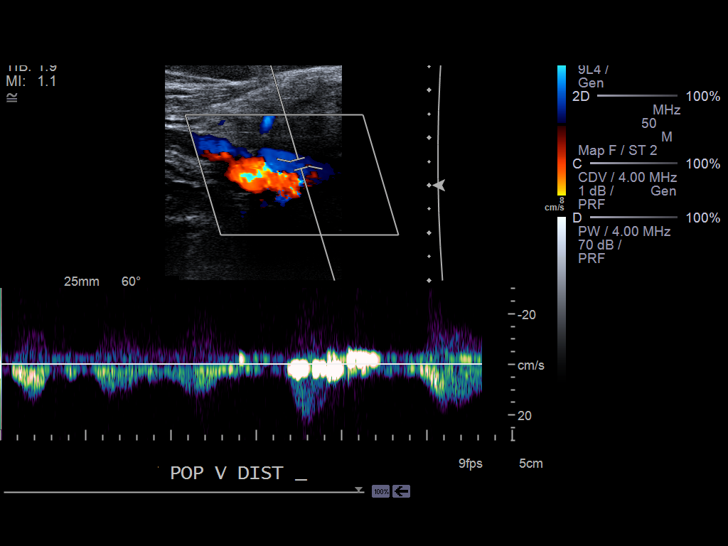
[im 24/46]
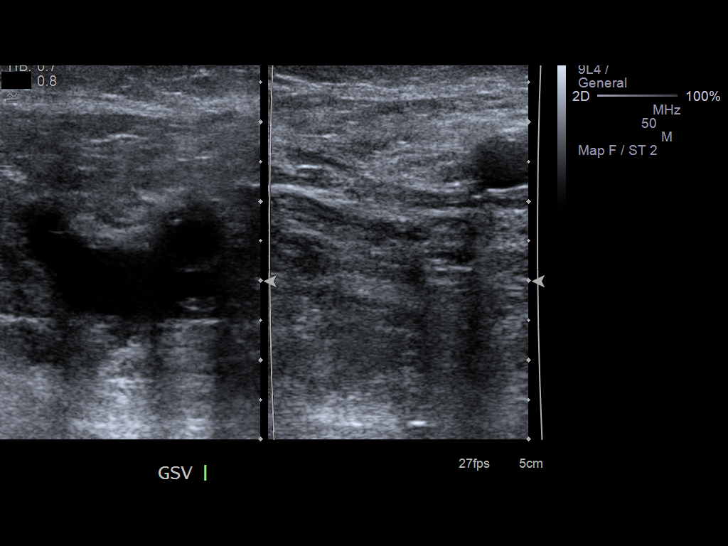
[im 28/46]
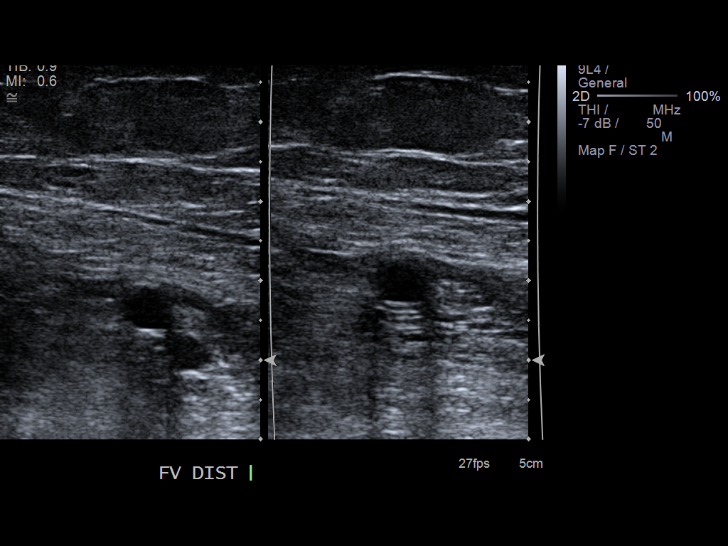
[im 32/46]
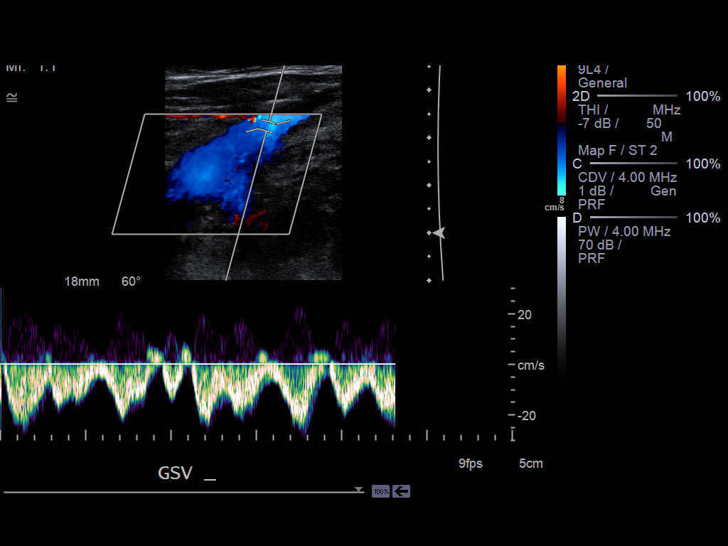
[im 36/46]
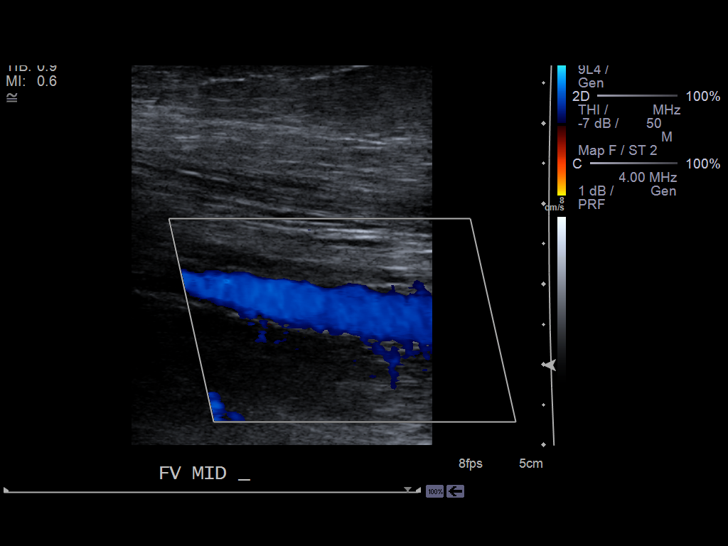
[im 38/46]
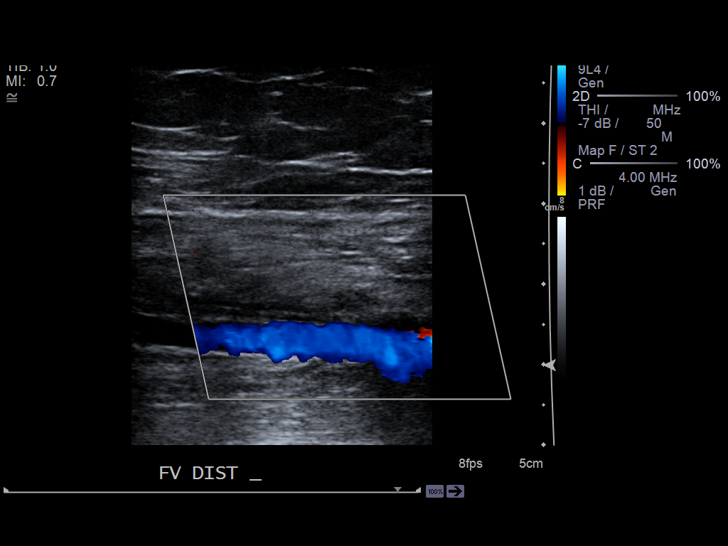
[im 42/46]
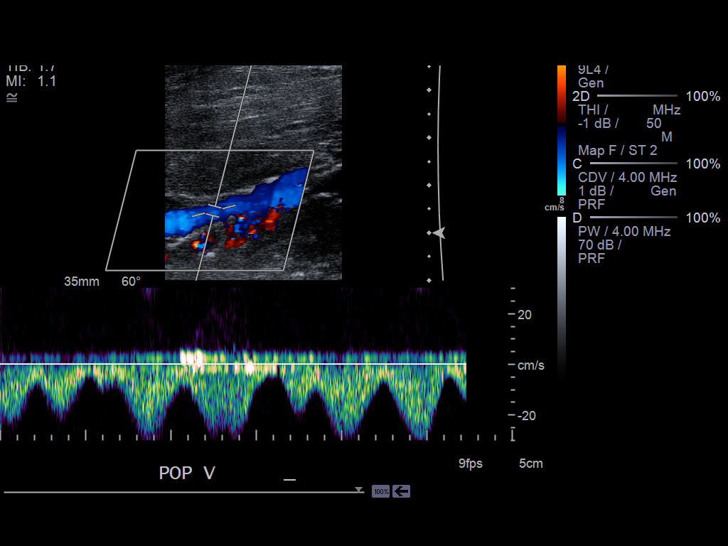
[im 46/46]
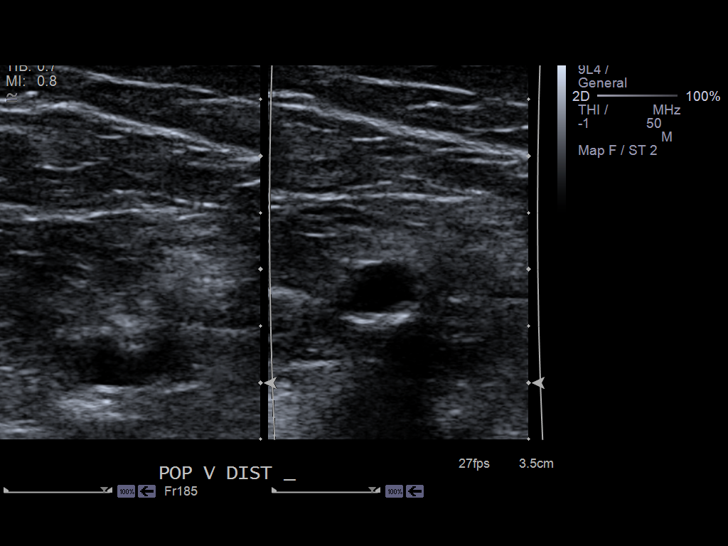

[14 of 24 positions shown; findings below may reference images not displayed]

PROCEDURE:     US  - US DOPPLER LOW EXTR BILATERAL  - October 31, 2011 [DATE]

RESULT:     The phasic, augmentation and Valsalva flow waveforms are normal
in appearance. The femoral and popliteal veins show complete compressibility
throughout their course bilaterally. Bilateral Doppler examination shows no
occlusion or evidence for deep vein thrombosis.
IMPRESSION: No deep vein thrombosis is identified on either side.

[REDACTED]

## 2014-04-07 IMAGING — CR DG CHEST 2V
1 series · 2 of 2 positions shown · non-contrast
Comparison: none

REASON FOR EXAM: sob, leukocytosis
COMMENTS:

[Series 1: ap · 0.17mm/px · 2 of 2 slices shown]
[im 1/2]
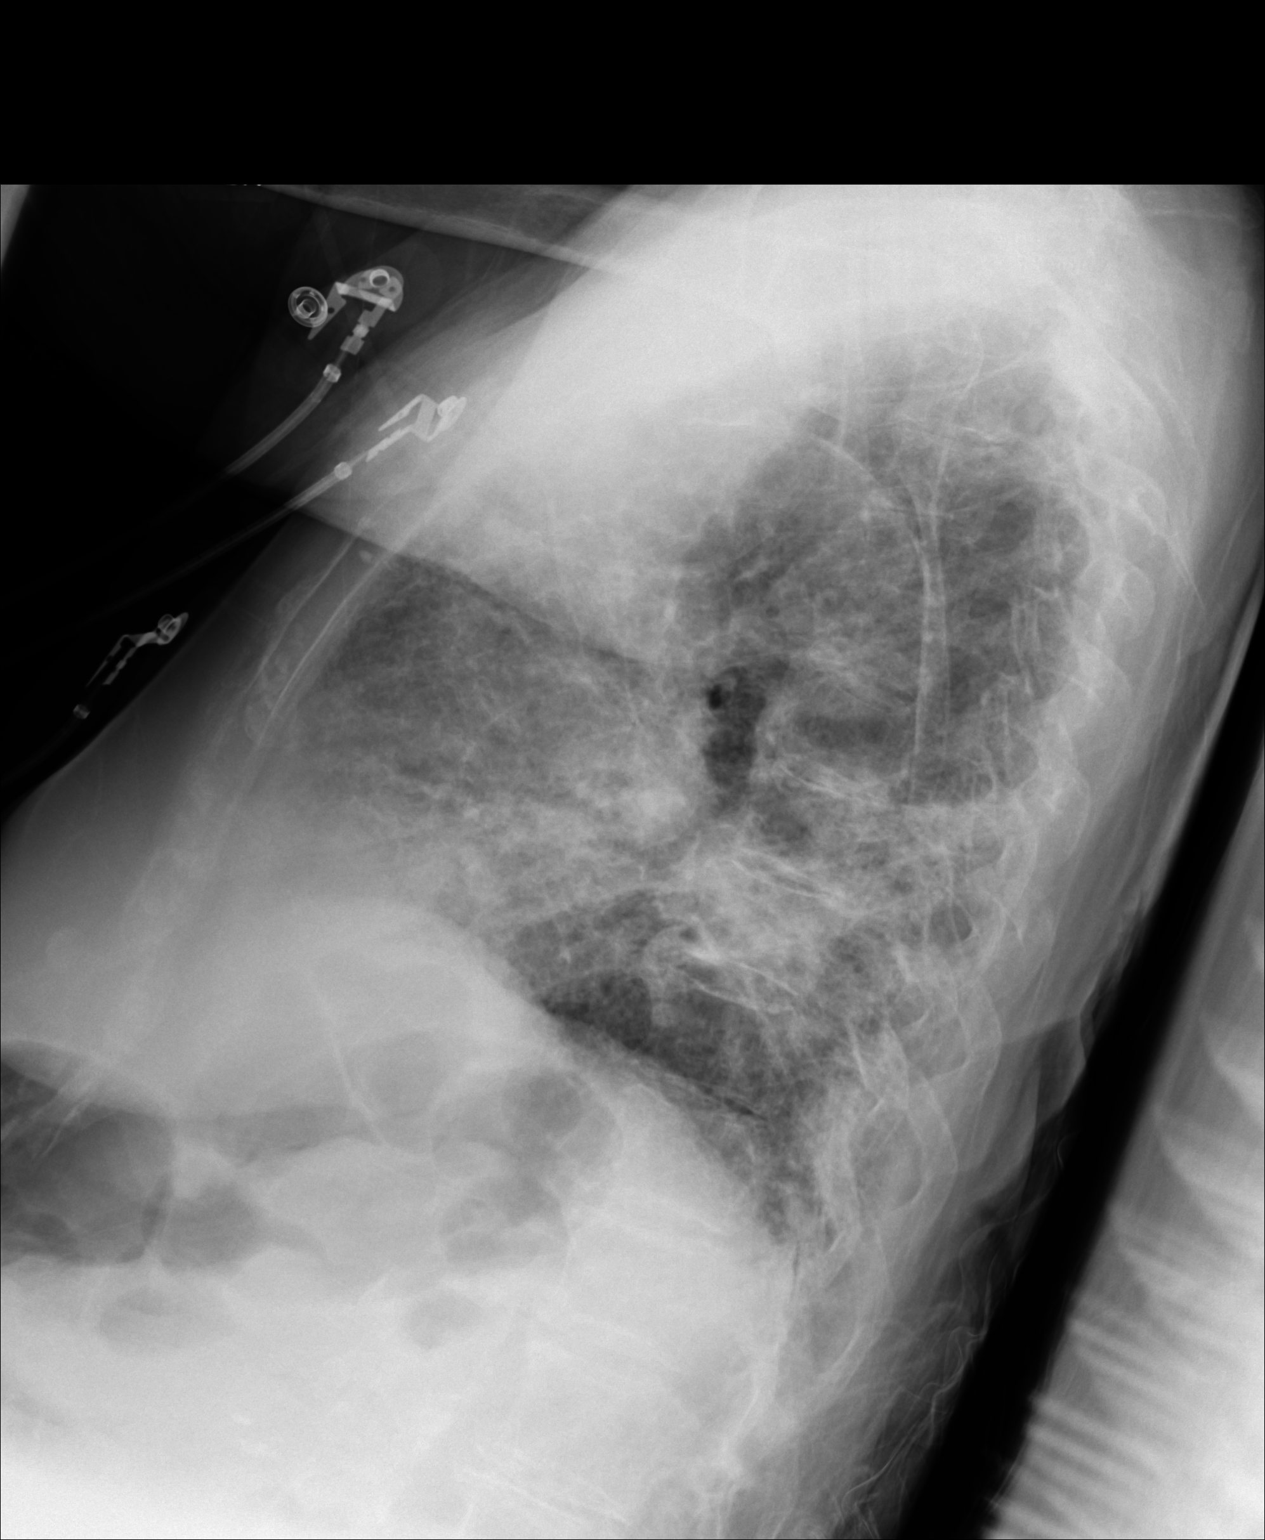
[im 2/2]
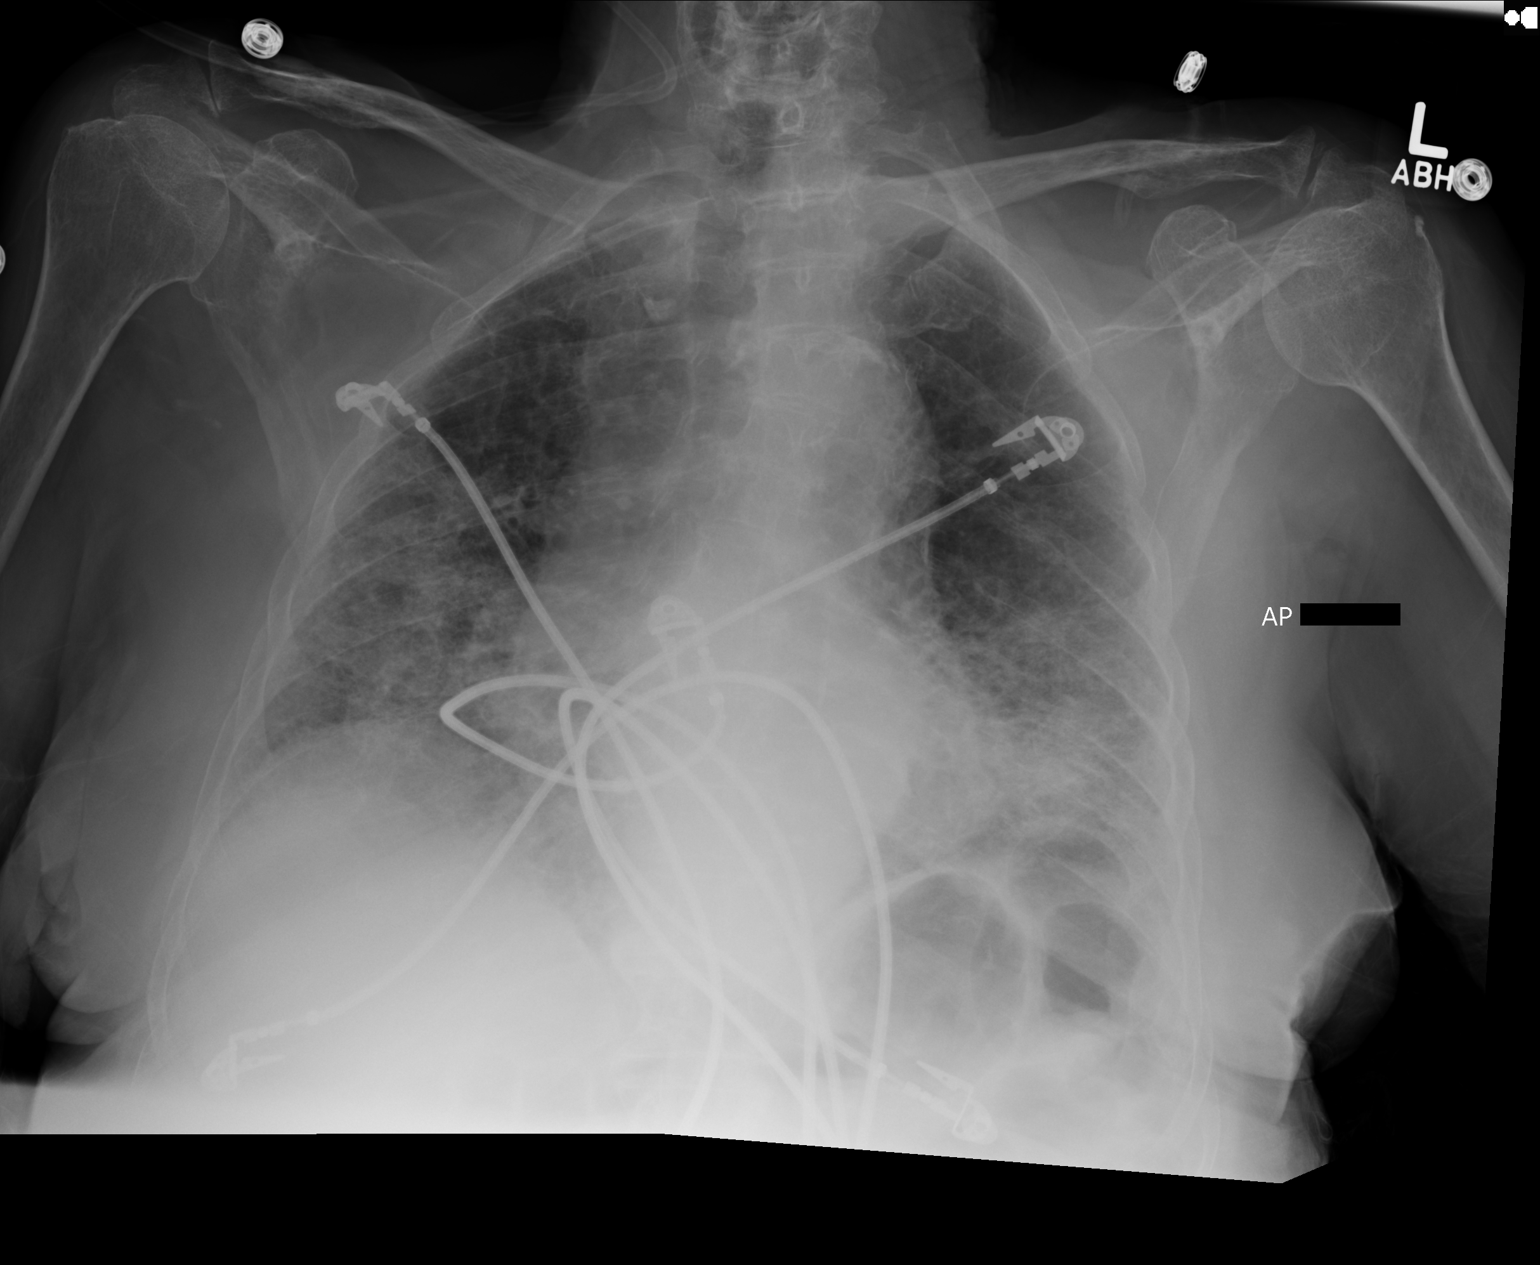

[2 of 2 positions shown; findings below may reference images not displayed]

PROCEDURE:     DXR - DXR CHEST PA (OR AP) AND LATERAL  - November 03, 2011  [DATE]

RESULT:     Comparison is made to the study October 31, 2011.

The lungs are slightly less well inflated. The interstitial markings remain
increased but have improved slightly. Confluent density lateral to left
cardiac apex is more conspicuous however. The cardiac silhouette is top
normal in size. The pulmonary vascularity is not clearly engorged. There is
tortuosity of the descending thoracic aorta. There are degenerative changes
of both shoulders.
IMPRESSION: The findings suggest bilateral pneumonia demonstrating both
interstitial as well as some alveolar components. Compared to the earlier
study allowing for differences in inflation there has not been dramatic
interval change.

## 2014-04-08 IMAGING — XA IR VASCULAR PROCEDURE
1 series · 1 of 1 positions shown · non-contrast
Comparison: none

[Series 1: single · 1 of 1 slices shown]
[im 1/1]
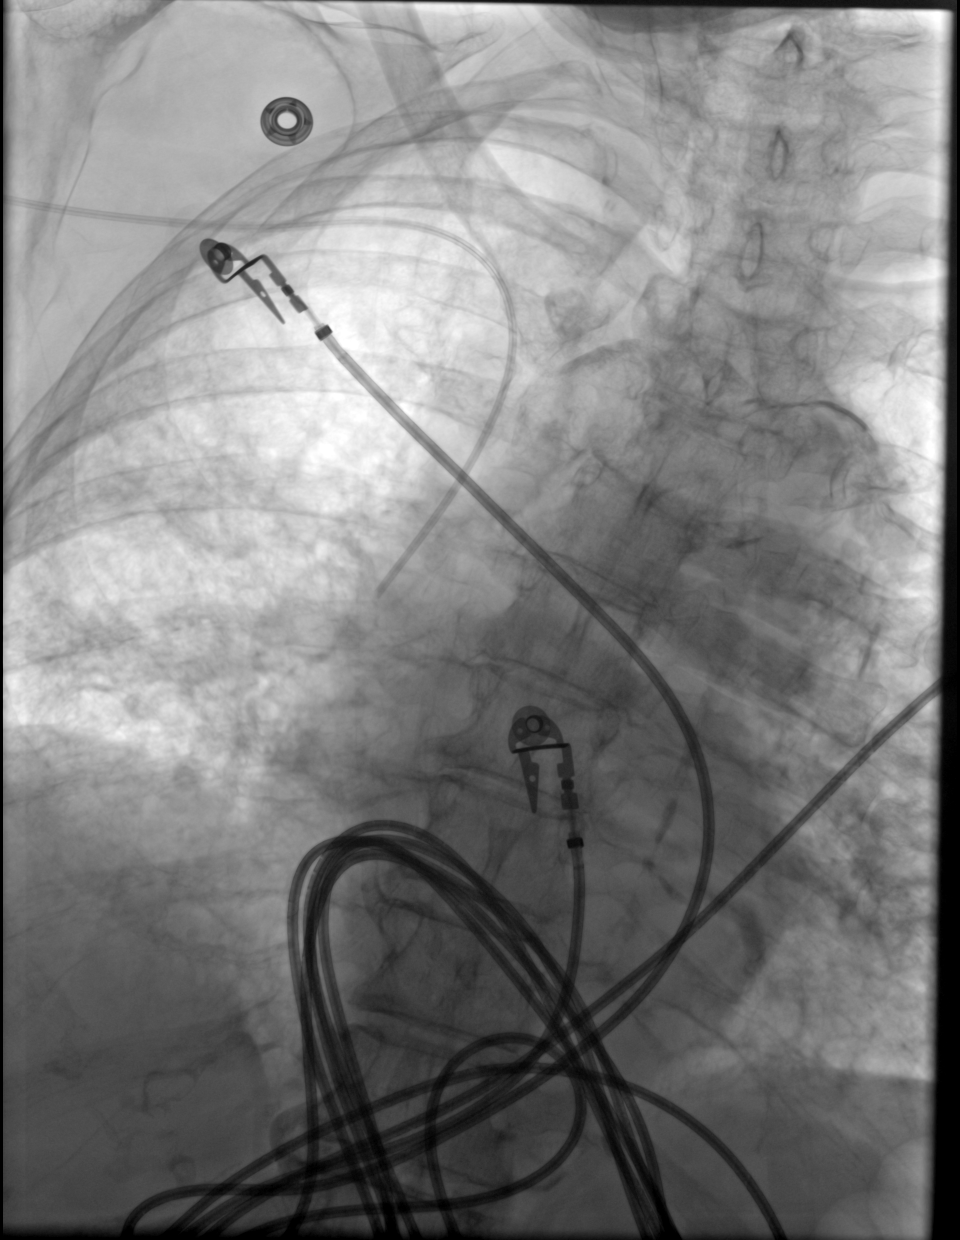

[1 of 1 positions shown; findings below may reference images not displayed]

IMAGES IMPORTED FROM THE [employer] WORKFLOW SYSTEM
NO DICTATION FOR STUDY

## 2014-05-16 DEATH — deceased

## 2014-10-08 NOTE — H&P (Signed)
PATIENT NAME:  Vickie Sawyer, Vickie Sawyer MR#:  [id number] DATE OF BIRTH:  [birth year]  DATE OF ADMISSION:  10/30/2011  REFERRING PHYSICIAN: Suella Broad, MD  PRIMARY CARE PHYSICIAN: Einar Crow, MD  CHIEF COMPLAINT: Shortness of breath and hypoxia.   HISTORY OF PRESENT ILLNESS: The patient is a pleasant 81 year old Caucasian female with a history of atrial fibrillation on Pradaxa and Coreg, hypertension, and diet-controlled diabetes who was just discharged yesterday after a prolonged hospitalization for left hip fracture for which the patient had a left hip hemiarthroplasty on 10/23/2011 and complications of UTI and acute blood loss anemia requiring transfusions of blood products, chronic obstructive pulmonary disease exacerbations, and hypoxia as well as possible volume overload who was sent back from [place]. The patient has been having increased orthopnea, some edema in her legs, and a cough which is nonproductive. The patient denies having any fevers or chills. This morning she was noted to have severe hypoxia with oxygen saturations in the 60s and was transferred here. The patient was put on oxygen, increased dosages, requiring nonrebreather currently. The patient also was given Lasix as well as vancomycin and Zosyn. X-ray of the chest shows bilateral alveolar airspace disease. Hospitalist services were contacted for further evaluation and management.   PAST MEDICAL HISTORY:  1. History of atrial fibrillation, maintained on Pradaxa and beta blocker. 2. Hypertension. 3. Diabetes, diet controlled. 4. History of chronic obstructive pulmonary disease. 5. Recent hospitalization for left hip fracture, left-sided pubic rami pelvic fractures, urinary tract infections, and acute blood loss anemia.   DISCHARGE MEDICATIONS: (As of yesterday) 1. Advair 200/50 one puff inhaled twice daily. 2. Albuterol/ipratropium nebulizers every six hours as needed. 3. Bisac-Evac suppository 10 mg rectal once a day as  needed. 4. Coreg 6.25 mg every 12 hours. 5. Ceftin 500 mg every 12 hours. 6. Glucagon if fasting sugars are less than 60.  7. My Acid 30 mL every six hours as needed for indigestion. 8. Milk of Magnesia p.r.n.  9. Mucinex 600 mg every 12 hours as needed for cough.  10. Sliding scale insulin. 11. Oxycodone 5 mg every four hours as needed for pain. 12. OxyContin 10 mg every 12 hours. 13. Pradaxa 75 mg two caps twice a day.  14. Prednisone taper.  15. Senna Plus one tablet twice a day.  16. Torsemide 5 mg daily.  17. Tylenol p.r.n.  18. Vitamin D3 1,000 international units 2 tabs daily.   ALLERGIES: No known drug allergies.   PAST SURGICAL HISTORY:  1. Left hip surgery on 10/23/2011.  2. History of cholecystectomy in February 2013.  3. Right hip fracture surgery in 2008.  SOCIAL HISTORY: No tobacco currently. Remote history of smoking, however. No alcohol or drug use. She is retired and lives with her husband.   FAMILY HISTORY: Mom with rheumatoid arthritis.   REVIEW OF SYSTEMS: CONSTITUTIONAL: No fever. There is fatigue. No weight changes. EYES: No blurry vision or double vision. ENT: Has a headache. No sore throat or snoring. RESPIRATORY: Has a nonproductive cough. No wheezing. Has history of chronic obstructive pulmonary disease. No history of congestive heart failure. Has dyspnea on exertion. No painful respirations. CARDIOVASCULAR: No chest pain. Two pillow orthopnea. Increased lower extremity edema. Has high blood pressure. No palpitations. GI: Denies nausea, vomiting, diarrhea, abdominal pain, melena, or rectal bleeding. GENITOURINARY: Denies dysuria, hematuria, or incontinence. ENDOCRINE: Denies polyuria, nocturia, or thyroid problems. HEME/LYMPH: Has anemia status post recent transfusion. No bleeding. SKIN: No new rashes. MUSCULOSKELETAL: Some arthritis and left hip pain.  NEURO: Denies numbness. Has history of dementia. PSYCH: No anxiety or insomnia.   PHYSICAL EXAMINATION:    VITAL SIGNS: Temperature was 96.7 on arrival, pulse rate was currently 160, respiratory rate 22, blood pressure 178/91, and oxygen saturation 96% on 100% nonrebreather.   GENERAL: The patient is an elderly Caucasian female sitting in bed in mild respiratory distress; however, she is talking in full sentences.   HEENT: Normocephalic, atraumatic. Pupils are equally round and reactive to light and accommodation. Anicteric sclerae. Dry mucous membranes.   NECK: Supple. No thyroid tenderness. No JVD appreciated.   LUNGS: Good air entry bilaterally, however, there are crackles in bilateral to mid bases. No wheezing or rhonchi.   HEART: S1 and S2, irregularly irregular. No murmurs appreciated. Tachycardic.   ABDOMEN: Soft, nontender, and nondistended. Positive bowel sounds.   EXTREMITIES: No significant lower extremity edema on the shins. There is perhaps 1+ in the ankles.  NEURO: Cranial nerves II through XII grossly intact. Strength 5/5 in all extremities.   PSYCH: Awake, alert, and oriented x2. Pleasant. Conversant.  LABS/STUDIES: BNP elevated at 4824. Glucose 122, BUN 36, creatinine 1.1, GFR 45, bilirubin 1.1, alkaline phosphatase 175, AST 27, ALT 38. Troponin negative x1. WBC 23,000 and was 16,000 on 10/27/2011. Hemoglobin 10 and was 9.6 on 10/27/2011, and platelets are 581. INR is 1.7. PT 20.1 and PTT is 69.6.   Urinalysis is not suggestive of infection.   ABG: pH 7.48, pCO2 37, and pO2 62.   X-ray of the chest: No official read, however, it looks like it is bilateral alveolar airspace disease. I do not see any significant pleural effusions.   EKG shows atrial fibrillation, rate is 100. No acute ST elevations or depressions.   ASSESSMENT AND PLAN: We have an 81 year old Caucasian female with recent hospitalization and discharge yesterday after sustaining a left hip fracture status post left hemiarthroplasty on 10/23/2011, a complicated hospital course with chronic obstructive  pulmonary disease, urinary tract infection, shortness of breath, and blood loss anemia who presents with acute shortness of breath, leukocytosis, tachycardia, and requiring nonrebreather. The patient also has elevated BNP and has some orthopnea. There are no fevers. At this point, for the acute on chronic respiratory failure, the likely causes would be congestive heart failure and/or pneumonia. The patient has elevated BNP and signs and symptoms of congestive heart failure with wet sounding lungs. She was on torsemide which I would hold and start Lasix IV and morphine as well as nitro paste. I would repeat an echocardiogram and check ins and outs. I would also obtain a cardiology consult. The patient also does have leukocytosis, but was on steroids recently. The patient has a nonproductive cough. I would start treating with vancomycin and Zosyn for now to cover broadly and get a sputum culture and blood cultures as well and check getting an x-ray of the chest in the morning, PA and lateral, for better monitoring, and continue the nonrebreather for now. I would also consider BiPAP. Wonder if there is also a pulmonary embolus involved given the recent hip surgery. I discussed the case with the radiologist. The patient has a depressed GFR and they had recommended IV fluids which seems counterproductive given the wet and CHF sounding picture. I will continue monitoring the GFR through diuresis and with antibiotics the patient's hypoxia does not recover we would, if allowed by the GFR, follow with CT with PE protocol. In the meantime, I would hold the Pradaxa and start therapeutic Lovenox. In regards to the  atrial fibrillation, I would continue the Coreg. I would start the patient on sliding scale insulin for her diabetes and check a hemoglobin A1c. I would continue bowel regimen and get physical therapy consult. The patient wants to be FULL CODE at this point.   TOTAL CRITICAL CARE TIME: 60  minutes. ____________________________ Vickie EatonShayiq Dolores Mcgovern, MD sa:slb D: 10/30/2011 11:45:56 ET     T: 10/30/2011 12:09:24 ET       JOB#: [id number] cc: Vickie EatonShayiq Raylea Adcox, MD, <Dictator> Vickie AmslerMarshall W. Dareen PianoAnderson, MD Vickie EatonSHAYIQ Jaqwon Manfred MD ELECTRONICALLY SIGNED 11/21/2011 12:33

## 2014-10-08 NOTE — Discharge Summary (Signed)
PATIENT NAME:  Vickie Sawyer, Vickie Sawyer MR#:  [id number] DATE OF BIRTH:  1922-08-20  DATE OF ADMISSION:  10/18/2011 DATE OF DISCHARGE:  10/29/2011  ADMITTING DIAGNOSES:  1. Left proximal femur subcapital fracture.  2. Left side pubic rami pelvic fractures.  DISCHARGE DIAGNOSES:  1. Left proximal femur subcapital fracture, status post hip hemiarthroplasty.  2. Left side pubic rami pelvic fractures. 3. Urinary tract infection.  4. Acute blood loss anemia. 5. Hypoxia. 6. Chronic obstructive pulmonary disease.  7. Right ankle pain. 8. Nausea. 9. Elevated blood sugars.  ATTENDING: Kennedy BuckerMichael Menz, MD [facility])  CONSULTING PHYSICIANS:  1. Crist Infanteharles Sikes, MD 2. Einar CrowMarshall Anderson, MD 3. Marisue IvanKanhka Linthavong, MD 4. Bethann PunchesMark Miller, MD  PROCEDURES: On 10/23/2011 the patient underwent left hip hemiarthroplasty by Dr. Rosita KeaMenz.   ANESTHESIA: Spinal.   ESTIMATED BLOOD LOSS: 200 mL.  SPECIMEN SENT: Femoral head.   OPERATIVE FINDINGS: Subcapital hip fracture with minimal degenerative joint disease.   IMPLANTS: Stryker with bipolar head and cemented stem.   DRAINS: No drains were placed.   COMPLICATIONS: No complications had occurred.  PATIENT HISTORY:  Mrs. Vickie Sawyer is an 81 year old with complaint of left hip and right ankle pain. She had a mechanical fall in her home last night that was not witnessed. There was no blow to the head or loss of consciousness. She presented to the emergency department for evaluation and was found to have left hip subcapital fracture per x-ray. She also had left side pubic rami fractures. Normally a household ambulator but has history of prior falls with ORIF of the right hip done about three years ago.   PAST MEDICAL HISTORY/PAST SURGICAL HISTORY:  1. Atrial fibrillation. 2. Dementia. 3. Angina. 4. Diabetes.  5. Hypertension.  6. Polymyalgia rheumatica. 7. Open cholecystectomy.  8. Hysterectomy. 9. Previous right hip open reduction and internal fixation.    ALLERGIES AND ADVERSE REACTIONS: None.   PHYSICAL EXAMINATION: CARDIAC: Irregular rate. RESPIRATORY: Normal respiratory effort. EXTREMITIES: Negative for cyanosis or clubbing. She of course is tender about the left hip. Distally in the left leg she has sensation intact and good capillary refill, but no palpable pulse, per Dr. Verlon SettingSikes. Concerning her right ankle, she has neurovascular components grossly intact.  HOSPITAL COURSE: The patient was admitted on 10/18/2011 because of the aforementioned fractures. Medicine had been consulted. The patient had been on Pradaxa for a while so several days were spent trying to get her PTT down to within normal limits. On 10/22/2011 she was almost therapeutic. She had been treated with her left leg in traction. Foley catheter had been placed. Physical therapy of course was on hold. Finally on 10/23/2011 her INR and PTT were within normal limits and she was a go for surgery. She was kept n.p.o. She had come through surgery well but of course had some acute blood loss anemia. She had been restarted on Pradaxa and aspirin, but later on the aspirin would be discontinued. She was treated with TED hose for deep vein thrombosis prophylaxis. Her left hip dressing was intact. Urinalysis did reveal some bacteria. The culture showed Escherichia coli as well as Enterococcus faecium. Initially she was treated with Bactrim per oral. The patient unfortunately developed some chest congestion and shortness of breath. She did have some coarseness of breath sounds and expiratory wheezes on 10/27/2011 and did not look well. She was seen of course by Dr. Dareen PianoAnderson who restarted her on some steroids and inhalers. He did think the Bactrim may have caused her some nausea so  IV Levaquin was started. Of note she had had some preoperative IV steroids because of lung issues. She was on nasal cannula oxygen pretty much her whole time here. In the end, it was decided that her oxygen saturations may have  been lower due to her atrial fibrillation. Her antibiotics would be discontinued and she would go to Ceftin per oral and oral steroids. She did pass multiple stools while here. She worked with physical therapy on multiple occasions but was not really able to ambulate because she had a lot of pain. She would complain of pain but yet did not ask for much pain medicine specifically. Her Foley catheter had been removed on 10/25/2011. In the end, she did tolerate her diet better. She was deemed stable on 2 liters nasal cannula oxygen while at rest.   CONDITION AT DISCHARGE: Stable.   DISPOSITION: [facility].   DISCHARGE MEDICATIONS:  1. Oxycodone 5 mg every four hours as needed for pain.  2. OxyContin 10 mg take 1 pill every 12 hours. This may be stopped in 14 days from 10/29/2011.  3. Senokot-S 1 tablet oral twice a day.  4. Milk of Magnesia 30 mL oral twice a day as needed for constipation.  5. Dulcolax 10 mg rectal daily as needed for constipation.  6. Antacid DS suspension 30 mL oral every six hours as needed for heartburn and indigestion. 7. Pradaxa 150 mg oral twice a day.  8. Insulin sliding scale Novolin R injection.  9. Advair Diskus 250/50 inhaler 1 puff inhalation twice a day.  10. Mucinex 600 mg oral twice a day as needed for cough or congestion.  11. Vitamin D3 2000 units oral daily. 12. Carvedilol 6.25 mg oral twice a day. 13. Prednisone tablet. The patient started at 60 mg oral daily and this is to be tapered down by 5 mg a day from 10/28/2011. On 10/30/2011 she will get 50 mg then the next day 45 and then the next day 40 and so until she is down to zero.  14. Torsemide 5 mg oral daily. 15. Ceftin 500 mg oral every 12 hours, stop in seven days from 10/28/2011. 16. Tylenol 500 to 1000 mg every six hours as needed for pain or fever.  17. Albuterol/ipratropium SVN 3 mL every six hours as needed for coughing or wheezing.  DISCHARGE INSTRUCTIONS AND FOLLOW-UP:  1. She is on 2  liters nasal cannula oxygen. May go up to 4 L if necessary. 2. Left hip sterile dressing changes as needed. On 11/07/2011 her left hip staples may be removed and Steri-Strips applied. Call [facility] for any sign of infection. 3. Encourage incentive spirometry. 4. Please put a pillow between her legs while she is in bed so she does not cross her left leg over.  5. Soapsuds enema as needed. 6. Physical therapy and occupational therapy consult. She is weight-bearing as tolerated on the left leg. We do expect her to be slow though in putting weight on the leg due to her pubic rami fractures.  7. Diet is 1800 calorie. Ensure three times a day, chocolate.  8. She is to follow up with Dr. Kennedy Bucker on 12/05/2011 at 9:45 a.m. for left hip x-rays at [facility]. Please call our office for any questions.  9. Check a CBC and metabolic panel on 10/30/2011.  ____________________________ Letta Moynahan. Koula Venier, [place] jrp:slb D: 10/29/2011 12:09:39 ET T: 10/29/2011 12:27:09 ET JOB#: [id number]  cc: Letta Moynahan. Clyde Canterbury, PA, <Dictator> Jabron Weese R Olivya Sobol  PA ELECTRONICALLY SIGNED 10/31/2011 16:39

## 2014-10-08 NOTE — Consult Note (Signed)
PATIENT NAME:  Vickie Sawyer, HEYMANN MR#:  [id number] DATE OF BIRTH:  Sep 07, 1922  DATE OF CONSULTATION:  10/18/2011  REFERRING PHYSICIAN:  Crist Infante, MD  CONSULTING PHYSICIAN:  Carney Corners. Rudene Re, MD  PRIMARY CARE PHYSICIAN: Einar Crow, MD   REASON FOR CONSULTATION: Medical management. The patient is status post fall and sustained a left hip fracture. She has underlying atrial fibrillation and hypertension.   HISTORY OF PRESENT ILLNESS: Vickie Sawyer is an 81 year old pleasant Caucasian female with history of atrial fibrillation maintained on Pradaxa, hypertension, diabetes mellitus on diet. The patient was admitted in February of this year for acute cholecystitis and underwent cholecystectomy. The patient reported that she fell today and does not recall or not certain how she tripped but she sustained severe pain in the left hip area. Evaluation here was consistent with left hip fracture. Orthopedic team had seen the patient and admitted her to the hospital. Medical team was consulted for medical clearance and follow-up. The patient denies having any headache. No head or neck injury. No pain elsewhere other than the left hip. Denies any chest pain. No palpitations. No loss of consciousness. No abdominal pain.   REVIEW OF SYSTEMS: CONSTITUTIONAL: Denies any fever. No chills. No fatigue. EYES: No blurring of vision. No double vision. ENT: No hearing impairment. No sore throat. No dysphagia. CARDIOVASCULAR: No chest pain. No shortness of breath. No edema or syncope. RESPIRATORY: No cough. No sputum production. No wheezing. No chest pain. GASTROINTESTINAL: No abdominal pain. No vomiting. No diarrhea. GENITOURINARY: No dysuria. No frequency of urination. MUSCULOSKELETAL: Refers to left hip joint pain. No swelling. No muscular pain or swelling other than the left hip area. INTEGUMENTARY: No skin rash. No ulcers. NEUROLOGY: No focal weakness. No seizure activity. No headache. PSYCHIATRY: No anxiety. No depression.  ENDOCRINE: No polyuria or polydipsia. No heat or cold intolerance.   PAST MEDICAL HISTORY:  1. History of fibrillation, maintained on chronic anticoagulation with Pradaxa. 2. Systemic hypertension.  3. Diabetes mellitus type II, on diet.  4. History of chronic obstructive pulmonary disease.   PAST SURGICAL HISTORY:  1. Cholecystectomy in February 2013. 2. Right hip fracture operated on in 2008.   SOCIAL HABITS: Nonsmoker. She has remote history of smoking. No history of alcohol or drug abuse.   SOCIAL HISTORY: The patient is retired. She lives at home with her husband.   FAMILY HISTORY: Both parents are deceased. She does not recall the circumstances of their death. No other significant family history of stroke, hypertension, or heart disease.   ADMISSION MEDICATIONS:  1. Carvedilol or Coreg 6.25 once a day.  2. Pradaxa 150 mg twice a day.  3. Advair 250/50 one twice a day.   ALLERGIES: No known drug allergies.   PHYSICAL EXAMINATION:   VITAL SIGNS: Blood pressure 175/95, respiratory rate 22, pulse 90, temperature 97.3, oxygen saturation 98%.   GENERAL APPEARANCE: Elderly lady laying in bed in no acute distress.   HEAD: No pallor. No icterus. No cyanosis.   EARS, NOSE, AND THROAT: Hearing was normal. Nasal mucosa, lips, tongue were normal. The patient is edentulous and she has dentures.   EYES: Normal iris and conjunctivae. Pupils about 5 mm, equal and reactive to light.   NECK: Supple. Trachea at midline. No thyromegaly. No cervical lymphadenopathy. No masses.   HEART: Regular S1, S2. No S3 or S4. No murmur. No gallop. No carotid bruits.   RESPIRATORY: Normal breathing pattern without use of accessory muscles. No rales. No wheezing.   ABDOMEN: Soft  without tenderness. No hepatosplenomegaly. No masses. No hernias.   SKIN: No ulcers. No subcutaneous nodules.   MUSCULOSKELETAL: No joint swelling. No clubbing.   NEUROLOGIC: Cranial nerves II through XII are intact. No  focal motor deficit. The patient is able to move her upper and lower extremities. She is splinting the left leg but she is able to freely move her left foot.   LABORATORY, DIAGNOSTIC, AND RADIOLOGICAL DATA: EKG showed atrial fibrillation with ventricular rate at 100 per minute. Unremarkable EKG.  Chest x-ray showed increased pulmonary markings consistent with prior pulmonary fibrosis. No effusion. No consolidation.   Blood work-up included glucose 125, BUN 20, creatinine 0.9, sodium 141, potassium 4.5. Normal liver function tests. CBC showed white count of 13,000, hemoglobin 12, hematocrit 39, platelet count 281. Her urinalysis is pending. Prothrombin time and PTT are pending as well.   ASSESSMENT:  1. Left hip fracture status post fall. 2. Atrial fibrillation, maintained on chronic anticoagulation with Pradaxa. 3. Systemic hypertension.  4. Diabetes mellitus, on diet.  5. Recent history of cholecystectomy.  6. Chronic obstructive pulmonary disease.  7. History of right hip fracture, operated on in 2008.   PLAN:  1. There is no contraindication to go ahead with the planned left hip surgery. I will hold Pradaxa. If needed perioperatively or postoperatively, may use heparin.  2. I will increase Coreg to 12.5 mg twice a day for better blood pressure control.  3. I will continue her Advair inhalation twice a day.  4. I would like to mention that during the last admission there was a note from Dr. Daniel NonesBert Klein regarding the CODE STATUS that is DO NOT RESUSCITATE. That was the wish of the patient and her husband. I readdressed this issue with the patient. She appears forgetful. She does not recall even having surgery a few months ago. She could not comment on the CODE STATUS. She also does not know whether there is prior LIVING WILL formulated or not.  5. Finally, I would like to mention that I placed the patient on Accu-Cheks and sliding scale in case it is needed.    ____________________________ Carney CornersAmir M. Rudene Rearwish, MD amd:drc D: 10/18/2011 01:07:00 ET T: 10/18/2011 09:10:56 ET JOB#: [id number]  cc: Carney CornersAmir M. Rudene Rearwish, MD, <Dictator> Zollie ScaleAMIR M Scarleth Brame MD ELECTRONICALLY SIGNED 10/18/2011 22:21

## 2014-10-08 NOTE — Op Note (Signed)
PATIENT NAME:  Vickie Sawyer, Vickie Sawyer MR#:  [id number] DATE OF BIRTH:  1922/12/04  DATE OF PROCEDURE:  10/23/2011  PREOPERATIVE DIAGNOSIS: Left femoral neck fracture, displaced.   POSTOPERATIVE DIAGNOSIS: Left femoral neck fracture, displaced.   PROCEDURE: Left hip hemiarthroplasty.   ANESTHESIA: Spinal.   SURGEON: Leitha SchullerMichael J. Ladajah Soltys, MD  DESCRIPTION OF PROCEDURE: The patient was brought to the operating room and after adequate anesthesia was obtained, the patient was placed in the lateral decubitus position using the pegboard with the left side up. After prepping and draping in the usual sterile fashion, patient identification and time-out procedures were completed. A posterior approach was made to the hip centered over the greater trochanter. The IT band was identified and incised along with the gluteal fascia and Charnley retractor placed with internal rotation. The fracture site was identified and short external rotators detached from the greater trochanter. A T capsulotomy was then carried out and the fracture site was exposed. The femoral neck cut was then carried out, resecting to a centimeter above the lesser trochanter. The femoral head was then removed and measured. It measured 45 mm and a 45-mm trial fit well. This was subsequently chosen as the implant size. The femur was approached with first a box osteotome, a ridged straight reamer, and then rasped. The #7 Press-fit 5 cement stem from the [facility] system was snug and was chosen as the implant. The femoral canal plug was sized and obtained and inserted. The canal was then thoroughly irrigated and dried. The cement was inserted down the canal and the #5 ODC stem inserted with appropriate anteversion. After the cement had set and the acetabulum was checked and free of any further cement, the  +0 Sawyer taper head and 45-mm bipolar head component were assembled down to the stem. The hip was reduced and was stable through a range of motion. The wound was then  thoroughly irrigated and closed with a running #1 Vicryl for the gluteal fascia and IT band, 2-0 Vicryl subcutaneously, and skin staples. Xeroform, 4 x 4's, ABD, and tape were applied followed by an abduction pillow. The patient was sent to the recovery room in stable condition. Estimated blood loss was  200 mL.   COMPLICATIONS: None.   SPECIMENS: Femoral head.   IMPLANTS: [employer] Omnifit HFX hip stem, size 5, Sawyer taper, with a size 4 cement plug, a bipolar universal head component 45-mm outer diameter, and a 26-mm Sawyer taper head for the bipolar component.     ____________________________ Leitha SchullerMichael J. Karlie Aung, MD mjm:bjt D: 10/23/2011 22:12:28 ET T: 10/24/2011 09:12:38 ET JOB#: [id number]  cc: Leitha SchullerMichael J. Kue Fox, MD, <Dictator> Leitha SchullerMICHAEL J Jenicka Coxe MD ELECTRONICALLY SIGNED 10/24/2011 17:05

## 2014-10-08 NOTE — Consult Note (Signed)
PATIENT NAME:  Vickie Sawyer, Vickie Sawyer MR#:  [id number] DATE OF BIRTH:  08/29/1922  DATE OF CONSULTATION:  10/31/2011  REFERRING PHYSICIAN:  Dr. Dareen PianoAnderson CONSULTING PHYSICIAN:  Marcina MillardAlexander Lula Kolton, MD  REFERRING PHYSICIAN: Dr. Dareen PianoAnderson.   CHIEF COMPLAINT: Shortness of breath.   HISTORY OF PRESENT ILLNESS: The patient is an 81 year old female referred for evaluation of possible congestive heart failure. The patient has a history of atrial fibrillation, currently on Pradaxa. She was recently hospitalized for a left hip fracture and underwent left hip arthroplasty. Pradaxa was held for several days and the patient has been immobile. [facility] where she was noted to have increasing shortness of breath and hypoxia. The patient was admitted to telemetry where notable labs were negative troponin with B-type natriuretic peptide of 4824. Chest x-ray revealed possible bilateral pulmonary edema.   PAST MEDICAL HISTORY:  1. Atrial fibrillation previously on Pradaxa.  2. Hypertension.  3. Diabetes.  4. Chronic obstructive pulmonary disease.  5. Recent hospitalization for left hip fracture status post surgery.   MEDICATIONS:  1. Coreg 6.25 mg q.12. 2. Pradaxa 75 mg b.i.d.  3. Furosemide 5 mg daily.  4. Advair 200/50 1 puff b.i.d.  5. Albuterol nebulizer q. 6 hours p.r.n.  6. Ceftin 500 mg b.i.d.  7. Glucagon for fasting sugars greater than 60.  8. Milk of magnesia p.r.n.  9. Mucinex 600 mg q. 12 hours.  10. Oxycodone 5 mg q. 4 hours p.r.n.  11. OxyContin 10 mg q.12 hours.  12. Prednisone taper.   SOCIAL HISTORY: The patient is married and lives with her husband.   FAMILY HISTORY: No immediate family history of coronary artery disease or myocardial infarction.   REVIEW OF SYSTEMS: CONSTITUTIONAL: No fever or chills. EYES: No blurry vision. EARS: No hearing loss. RESPIRATORY: Shortness of breath as described above. CARDIOVASCULAR: The patient does  have orthopnea. Denies chest pain. GASTROINTESTINAL: No nausea, vomiting, or diarrhea. GU: No dysuria or hematuria. ENDOCRINE: No polyuria or polydipsia. MUSCULOSKELETAL: The patient does have left hip pain. NEUROLOGICAL: The patient denies focal muscle weakness or numbness. PSYCH: No depression or anxiety.   PHYSICAL EXAMINATION:  VITAL SIGNS: Blood pressure 138/85, pulse 82, respirations 20, temperature 98.3, pulse oximetry 97%.   HEENT: Pupils equal and reactive to light and accommodation.   NECK: Supple without thyromegaly.   LUNGS: Scattered expiratory wheezes.   CARDIOVASCULAR: Normal jugular venous pressure. Normal point of maximal impulse. Regular rate, irregularly irregular rhythm. Normal S1, S2. No appreciable gallop, murmur, or rub.   ABDOMEN: Soft and nontender.   EXTREMITIES: Pulses were intact bilaterally.   MUSCULOSKELETAL: Normal muscle tone.   NEUROLOGIC: The patient was alert and oriented times three. Motor and sensory both grossly intact.   IMPRESSION: This is an 81 year old female with history of atrial fibrillation previously on Pradaxa which was withheld for several days for left hip surgery. The patient has not been on any prophylactic anticoagulation. This raises the suspicion for possible pulmonary emboli. The patient has chronic renal insufficiency and CT scan with contrast has been deferred. VQ scan is pending.   RECOMMENDATIONS:  1. Agree with overall current therapy.  2. Review 2-D echocardiogram.  3. Lasix IV p.r.n.  4. Await results of VQ scan.  5. Continue empiric Lovenox.    ____________________________ Marcina MillardAlexander Detroit Frieden, MD ap:bjt D: 10/31/2011 08:26:08 ET T: 10/31/2011 10:30:36 ET JOB#: [id number]  cc: Marcina MillardAlexander Lillian Ballester, MD, <Dictator> Marcina MillardALEXANDER Brett Soza MD ELECTRONICALLY SIGNED 11/27/2011 17:39

## 2014-10-08 NOTE — Op Note (Signed)
PATIENT NAME:  Verdene LennertRUSS, Rosilyn C MR#:  [id number] DATE OF BIRTH:  07-27-1922  DATE OF PROCEDURE:  11/04/2011  PREOPERATIVE DIAGNOSIS:  Pneumonia.  POSTOPERATIVE DIAGNOSIS: Pneumonia.  PROCEDURES:  1. Ultrasound guidance for vascular access to basilic vein.  2. Fluoroscopic guidance for placement of catheter.  3. Insertion of peripherally inserted central venous catheter, right arm.  SURGEON: Renford DillsGregory G. Nakai Pollio, MD  ANESTHESIA: Local.   ESTIMATED BLOOD LOSS: Minimal.   INDICATION FOR PROCEDURE: Antibiotics greater than seven days.   DESCRIPTION OF PROCEDURE: The patient's right arm was sterilely prepped and draped, and a sterile surgical field was created. The basilic vein was accessed under direct ultrasound guidance without difficulty with a micropuncture needle and permanent image was recorded. 0.018 wire was then placed into the superior vena cava. Peel-away sheath was placed over the wire. A single lumen peripherally inserted central venous catheter was then placed over the wire and the wire and peel-away sheath were removed. The catheter tip was placed into the superior vena cava and was secured at the skin at 35 cm with a sterile dressing. The catheter withdrew blood well and flushed easily with heparinized saline. The patient tolerated procedure well.  ____________________________ Renford DillsGregory G. Fatisha Rabalais, MD ggs:cms D: 11/04/2011 19:24:41 ET T: 11/05/2011 09:07:40 ET JOB#: [id number]  cc: Renford DillsGregory G. Mannie Ohlin, MD, <Dictator> Renford DillsGREGORY G Zhyon Antenucci MD ELECTRONICALLY SIGNED 11/07/2011 10:56

## 2014-10-08 NOTE — H&P (Signed)
PATIENT NAME:  Vickie Sawyer, Vickie Sawyer MR#:  [id number] DATE OF BIRTH:  12-Feb-1923  DATE OF ADMISSION:  07/19/2011  CHIEF COMPLAINT: Chest pain, altered mental status, dysuria.   HISTORY OF PRESENT ILLNESS: This is an 81 year old female with history of atrial fibrillation, hypertension, diabetes, chronic kidney disease, previous stroke, with underlying dementia by report. Per her family members, she was in her usual state of health until yesterday, when she began having urgency to go to urinate about every hour. She reports she was able to urinate small amounts. The urine was reported to have a foul odor to it, but no blood was seen. She had not had nausea, vomiting, fevers, or chills, although there were times where she appeared to be somewhat diaphoretic. She had an episode of chest pain briefly yesterday, for which they did not seek medical attention. She has had a little bit of cough, nonproductive, which is better today. She seemed to be more confused, she was having more difficulty getting around, unable to get herself out of bed, and so she was brought to the Emergency Room by family. They report she really is not eating or drinking anything either. In the Emergency Room, she was found to have white blood cell count greater than 19,000, with an elevated lactate level, with otherwise unremarkable examination. Urinalysis revealed evidence of microscopic hematuria without clear evidence of urinary tract infection, with culture pending at this time. She is admitted now with altered mental status, chest pain, leukocytosis, with hematuria.   PAST MEDICAL HISTORY:  1. Atrial fibrillation, on Pradaxa. 2. Hypertension.  3. Diabetes mellitus, diet controlled.  4. History of cerebrovascular accident.  5. Chronic kidney disease.  6. Hyperlipidemia.  7. History of interstitial lung disease by history.  8. History of polymyalgia rheumatica with negative temporal artery biopsy.  9. Scoliosis and degenerative joint  disease.  10. Status post hysterectomy.   ALLERGIES: Labetalol caused mouth swelling, but she tolerates atenolol. Intolerant of Lescol and Lipitor, but tolerates Pravachol.  MEDICATIONS:  1. Flonase two sprays to each nostril daily.  2. Flovent 110 mcg metered-dose inhaler 2 puffs twice a day. 3. Multivitamin 1 p.o. daily. 4. Nitroglycerin as needed.  5. Vitamin D 2000 units p.o. daily.  6. Zetia 10 mg p.o. daily. 7. Pradaxa 150 mg p.o. twice a day. 8. Pravachol 20 mg p.o. at bedtime. 9. Atenolol 50 mg p.o. daily.   SOCIAL HISTORY: Very remote tobacco. No alcohol.   FAMILY HISTORY: Hypertension.   REVIEW OF SYSTEMS: Please see history of present illness. No dyspnea or wheeze; no diarrhea; no rash or significant edema. The remainder of complete review of systems is negative, although it is limited by the patient's memory status and by the fact that the patient's husband answers majority of the questions whether they were directed to the  patient or not.  PHYSICAL EXAMINATION:   VITAL SIGNS: Temperature 97.2, blood pressure 160/114, pulse 96, weight 82 kg.   GENERAL: Elderly female, alert, not oriented, in no distress.   EYES: Pupils round and reactive to light. Lids and conjunctiva unremarkable.   EARS, NOSE, AND THROAT: External examination unremarkable. Oropharynx is slightly dry without lesions.   NECK: Supple. Trachea midline. No thyromegaly.   CARDIOVASCULAR: Irregularly irregular without gallops or rubs. Carotid and radial pulses 2+.   LUNGS: Few crackles are heard bilaterally without wheeze or retractions.   ABDOMEN: Soft, distended, very quiet bowel sounds. No guard, rebound. No costovertebral angle tenderness.   SKIN: No significant rashes or  nodules.   LYMPH NODES: No cervical or supraclavicular nodes.   MUSCULOSKELETAL: No clubbing or cyanosis. No significant edema.   NEUROLOGIC: Cranial nerves intact. Appears to move all extremities with some generalized lower  extremity weakness, gait cannot be tested.   DATA: Urinalysis with 150 mg/dL glucose, 46 red cells with 1 white cell per high-power field.   Cardiac enzymes negative. Glucose 183 with BUN 19 and creatinine 1.34. Total bilirubin 1.3; the remainder of liver enzymes normal. White count 19.9 with hematocrit 42 and platelets 305.   Chest x-ray is unremarkable.   Influenza A and B antigens negative.   CT of the head with old strokes without acute change.   Lactic acid level 3.1.  IMPRESSION AND PLAN:  1. Leukocytosis, chest pain, and hematuria: Overall etiology unclear, but worrisome for possible urinary source, possible obstruction, early urinary tract infection, with possible early sepsis. We will get CT of the abdomen and pelvis without contrast, renal stone protocol, to further evaluate as well as to further evaluate her elevated lactate level. Empiric Levaquin, given her significant leukocytosis and possibility of early systemic antiinflammatory response syndrome.  2. Accelerated hypertension: Continue atenolol, which apparently she tolerates at home. Add low dose lisinopril and follow renal function, blood pressure.  3. Chest pain: Follow cardiac enzymes. On Pradaxa already for atrial fibrillation, and so will not add aspirin. Nitroglycerin if needed. Protonix was added in the event that this was related to reflux.  4. Cough: She apparently has some underlying chronic obstructive pulmonary disease based on her medications, but I can hear no wheezes currently. Chest x-ray is clear. We will follow this for now.   CODE STATUS: I discussed this with the patient and her husband. He states they would not wish to be resuscitated under any circumstances, so she will be DO NOT RESUSCITATE in accordance with his and her wishes.  ____________________________ Lynnea FerrierBert J. Taniah Reinecke III, MD bjk:slb D: 07/19/2011 10:47:09 ET T: 07/19/2011 11:26:54 ET JOB#: [id number]  cc: Lynnea FerrierBert J. Norena Bratton III, MD, <Dictator> Daniel NonesBERT  Miles Leyda MD ELECTRONICALLY SIGNED 07/21/2011 7:43

## 2014-10-08 NOTE — Consult Note (Signed)
PATIENT NAME:  MAXX, CALAWAY MR#:  [id number] DATE OF BIRTH:  11-05-1922  DATE OF CONSULTATION:  07/19/2011  REFERRING PHYSICIAN:   CONSULTING PHYSICIAN:  Tahsin Benyo S. Cecelia Byars, MD  HISTORY OF PRESENT ILLNESS: This patient was seen by me in consultation because Dr. Daniel Nones asked me to see this patient. She was admitted in this hospital on 07/19/2011 because of altered mental status, dysuria and chest pain. Investigation was found to have acute cholecystitis and she has a history of atrial fibrillation, hypertension and diabetes and chronic kidney disease. She also has history of previous stroke and dementia per her family member. She was complaining of pain mostly in the right upper quadrant of the abdomen, also having difficulty urination and was passing very small amount of urine. She did not have any complaint of nausea, vomiting, fevers or chills. She had a CAT scan of the abdomen done in Emergency Room and was found to have acute cholecystitis with pericholecystic fluid.  PAST MEDICAL HISTORY: 1. Atrial fibrillation. 2. Hypertension. 3. Diabetes. 4. History of CVA. 5. Chronic renal disease.  6. Hyperlipidemia.  7. History of interstitial lung disease. 8. History of polymyalgia rheumatica. 9. Scoliosis. 10. Status post hysterectomy.   ALLERGIES: She was allergic to labetalol which caused her some swelling of the lips.   MEDICATIONS: 1. Flonase spray. 2. Flovent. 3. Multivitamin.  4. Nitroglycerin as needed. 5. Vitamin D.  6. Zetia 10 mg daily. 7. Pradaxa 150 mg twice a day. 8. Pravachol. 9. Atenolol.   SOCIAL HISTORY: Remote history of tobacco. No alcohol.   REVIEW OF SYSTEMS: Patient basically complains of dysuria, wheezing in the chest, also complains of pain in the right side of the abdomen and she had before this kind of pain before but it got worse. She is also complaining of not eating enough.  PHYSICAL EXAMINATION: VITAL SIGNS: Temperature 97, blood pressure 160/114,  pulse 96.   GENERAL: Elderly female, alert, sometimes gives confusing answers.   HEENT: Eyes normal. Ears normal.    NECK: Supple.   CARDIOVASCULAR: Irregularly, irregular pulse due to atrial fibrillation. Carotid and radial pulses 2+.  LUNGS: Fine crackles bilaterally in the chest. No wheezing.   ABDOMEN: Soft. Tenderness in the right upper quadrant of the abdomen. No guarding but had some tenderness on deep palpation.   LABORATORY, DIAGNOSTIC AND RADIOLOGICAL DATA: Urinalysis showed some pus in the urine. Her blood count was elevated. Liver function: One of the bilirubin was slightly elevated. She had also cardiac enzymes done which remained okay. CT of the head with old strokes. Lactic acid level 3.1. Chest x-ray was unremarkable. Cardiac enzymes are negative.   IMPRESSION: At the time of admission was leukocytosis, chest pain, hematuria, possible urinary tract infection, possible acute cholecystitis. CAT scan of the abdomen showed inflammation around the gallbladder, thickened gallbladder wall and also impression hypertensive heart disease. Chest pain, cough.   CODE STATUS: NO CODE at this time. Discussed with the patient and her husband and they told me DO NOT RESUSCITATE.   On examination of the abdomen she was tender in the right upper quadrant of the abdomen. She had good bowel sounds. Rest of the abdomen is soft.   IMPRESSION: After reviewing the ultrasound which showed the patient had gallstones and the problem was she was taking Pradaxa for atrial fibrillation so we could not operate on her today but plan at this time would be to put her on IV antibiotic like Zosyn and continue watching her. Hopefully it might  be after 24 hours when the Pradaxa half life is decreased so I can take her to surgery. She will need probably open cholecystectomy because she had a big scar in the abdomen from the previous surgery and she might have adhesions all over but this it looks like will need surgery  at this admission.   ____________________________ Alton RevereMasud S. Cecelia ByarsHashmi, MD msh:cms D: 07/21/2011 13:01:00 ET T: 07/21/2011 13:13:57 ET JOB#: [id number]  cc: Bralee Feldt S. Cecelia ByarsHashmi, MD, <Dictator> Meryle ReadyMASUD S Emanuel Campos MD ELECTRONICALLY SIGNED 07/24/2011 12:32

## 2014-10-08 NOTE — Op Note (Signed)
PATIENT NAME:  Vickie Sawyer, Vickie Sawyer MR#:  [id number] DATE OF BIRTH:  11/18/1922  DATE OF PROCEDURE:  07/20/2011  PREOPERATIVE DIAGNOSIS: Acute gangrenous gallbladder.   POSTOPERATIVE DIAGNOSIS: Acute gangrenous gallbladder.  OPERATION: Attempted laparoscopic cholecystectomy and then open cholecystectomy.   SURGEON: Heith Haigler S. Zelda Reames, MD   FINDINGS: The patient had a gangrenous gallbladder and a lot of adhesions near the umbilical area but had a lot of pus over the surface of the liver and the gangrenous gallbladder a lot of adhesions and a thickened gallbladder.   OPERATIVE PROCEDURE: After the patient was then brought to surgery under general anesthesia, the abdomen was then prepped and draped. A small incision was made near the umbilicus. After cutting the skin and subcutaneous tissue, the fascia was cut, the abdomen was entered, and the trocar was inserted. The patient had a lot of adhesions due to previous surgery. I was able to enter into the abdomen. Another trocar was put in the epigastric region. The patient was found to have a lot of pus on the surface of the gallbladder and also gallbladder looked very thickened and gangrenous and it was so hard to dissect out so I decided to open her up because she was on Pradaxa and  was stopped about 24 hours ago but she was getting worse. She had a lot of oozing from the umbilical wound as well as from the liver area so I decided to open her directly and then take out the gallbladder that way.   After that we made an incision in the right upper quadrant cutting skin and subcutaneous tissue. The fascia was cut. The abdomen was entered. After entering the abdomen, the gallbladder was very angry looking. All the pus was then suctioned out. Omentum and colon was stuck to the gallbladder. It was then dissected free and the fundus of the gallbladder was all gangrenous. The gallbladder was opened up. Thousands of stones were then removed and gallbladder wall was very  thickened and black. Dissection was done from superior down, a lot of oozing from the surface of the liver because of the problem with coagulation but I was able to put pressure on it and kept on going down. The cystic artery was reached which was then suture ligated and tied. The patient had a lot of adhesions in the lower part of the gallbladder where it was attached to the cystic duct gallbladder junction. Very carefully it was dissected and the gallbladder just came in the hand and the cystic duct which was very thickened. I then took out all the stones and clamped it just near the Hartmann's pouch and then suture ligated it. After that, irrigation of the whole area was done. Bleeding was stopped with the Bovie. I also put a piece of Surgicel and Avitene in the liver bed. A #10 drain was put in there and was brought out in separate stab wound. After that, irrigation of the abdomen was performed completely and the peritoneum was closed with running Vicryl, fascia was closed with interrupted Vicryl, skin was closed with clips. The incision in the umbilical area was then closed and staples were then applied. The patient tolerated the procedure well and was sent to the recovery room in satisfactory condition.   ____________________________ Alton RevereMasud S. Cecelia ByarsHashmi, MD msh:drc D: 07/20/2011 16:42:43 ET T: 07/21/2011 10:05:37 ET JOB#: [id number]  cc: Rickita Forstner S. Cecelia ByarsHashmi, MD, <Dictator> Lynnea FerrierBert J. Klein III, MD Meryle ReadyMASUD S Kylen Schliep MD ELECTRONICALLY SIGNED 07/24/2011 12:32

## 2014-10-08 NOTE — H&P (Signed)
Subjective/Chief Complaint Left hip pain and right ankle pain    History of Present Illness mechanical fall in her home last night.  not witnessed.  No blow to the head or LOC.  No other noted injury except as stated above.  Presented to ED for evaluation and found to have Left hip fracture.  Admitted to the ortho service and medicine following for co-management.  Patient on pradaxa for history of atrial fibrillation.  Normally a household ambulator but history of prior falls with ORIF of R hip approximately 3 years ago.    Past History recent cholecystectomy (open)   Past Med/Surgical Hx:  Atrial Fibrillation:   Dementia:   angina:   DM:   polymyalgia rheumatica:   Hypertension:   hysterectomy:   ALLERGIES:  No Known Allergies:   HOME MEDICATIONS: Medication Instructions Status  Pradaxa 150 mg oral capsule 1 cap(s) orally 2 times a day Active  carvedilol 6.25 mg oral tablet 1 tab(s) orally 2 times a day Active   Family and Social History:   Social History negative tobacco, negative ETOH    Place of Living Home   Review of Systems:   Subjective/Chief Complaint left hip and right ankle pain    Fever/Chills No    Cough No    Sputum No    Abdominal Pain No    Diarrhea No    Constipation No    Nausea/Vomiting No    SOB/DOE No    Chest Pain No    Dysuria No    Tolerating Diet Yes    Medications/Allergies Reviewed Medications/Allergies reviewed   Physical Exam:   GEN no acute distress    HEENT hearing intact to voice    NECK supple    RESP normal resp effort    CARD irregular rate    ABD denies tenderness  soft    GU foley catheter in place    LYMPH - inguinal    EXTR negative cyanosis/clubbing    SKIN normal to palpation    NEURO follows commands    PSYCH alert    Additional Comments LLE tender at the left hip EHL, FHL, TA/GS intact, sensation intact throughout warm and well perfused but no palpable pulse, good cap refill  RLE  tender at the right ankle EHL, FHL, TA/GS intact, sensation intact throughout warm and well perfused, 1+ dp, good cap refill   Routine Chem:  03-May-13 22:16    Glucose, Serum 125   BUN 20   Creatinine (comp) 0.99   Sodium, Serum 141   Potassium, Serum 4.5   Chloride, Serum 109   CO2, Serum 26   Calcium (Total), Serum 9.1  Hepatic:  03-May-13 22:16    Bilirubin, Total 0.6   Alkaline Phosphatase 82   SGPT (ALT) 24   SGOT (AST) 32   Total Protein, Serum 7.2   Albumin, Serum 3.5  Routine Chem:  03-May-13 22:16    Osmolality (calc) 285   eGFR (African American) 59   eGFR (Non-African American) 51   Anion Gap 6  Routine Hem:  03-May-13 22:16    WBC (CBC) 13.2   RBC (CBC) 4.25   Hemoglobin (CBC) 12.3   Hematocrit (CBC) 39.2   Platelet Count (CBC) 281   MCV 92   MCH 29.0   MCHC 31.4   RDW 15.1  Routine UA:  04-May-13 00:15    Color (UA) Yellow   Clarity (UA) Clear   Glucose (UA) Negative   Bilirubin (UA) Negative  Ketones (UA) Negative   Specific Gravity (UA) 1.016   Blood (UA) Negative   pH (UA) 5.0   Protein (UA) Negative   Nitrite (UA) Negative   Leukocyte Esterase (UA) Negative   WBC (UA) 1 /HPF   Bacteria (UA) TRACE   Epithelial Cells (UA) <1 /HPF   Mucous (UA) PRESENT   Hyaline Cast (UA) 1 /LPF  Routine Coag:  04-May-13 03:19    Prothrombin 22.6   INR 2.0   Activated PTT (APTT) 123.3  Blood Glucose:  04-May-13 07:54    POCT Blood Glucose 217   Radiology Results: XRay:    03-May-13 23:30, Hip Left Complete   Hip Left Complete   REASON FOR EXAM:      COMMENTS:       PROCEDURE: DXR - DXR HIP LEFT COMPLETE  - Oct 17 2011 11:30PM     RESULT:  The patient has sustained a subcapital fracture of the left hip.   The pubic rami appear intact as does the iliac bone. The   intertrochanteric region and proximal femoral shaft appear intact.    IMPRESSION:  The patient has sustained a subcapital fracture of the left   hip.    Dictation Site:  1      Addendum: An accompanying AP view of the pelvis reveals that subtle   lucency over thelateral aspect of the left superior pubic ramus is   indeed a fracture. There is also a fracture of the parasymphyseal portion   of the left inferior pubic ramus that is visible on the accompanying no   the film.    This addendum was created at 7:52 a.m. on 18 Oct 2011.          Verified By: DAVID A. Martinique, M.D., MD    [phone] 23:30, Pelvis AP Only   Pelvis AP Only   REASON FOR EXAM:    trauma  COMMENTS:       PROCEDURE: DXR - DXR PELVIS AP ONLY  - Oct 17 2011 11:30PM     RESULT: The bony pelvis is osteopenic. There is a nondisplaced fracture   through the lateral aspect of the left superior pubic ramus. There may be   a fracture in the parasymphyseal portion of the left inferior pubic   ramus. The has patient sustained subcapital fracture of the left hip.     Included are views of the right femur. The patient has undergone   previous ORIF for a femoral neck fracture. Positioning of the orthopedic   appliance appears good. An acute fracture of the right hemipelvis is not   demonstrated.    IMPRESSION:  The patient has sustained fractures of the superior and   inferior pubic ramus and has a known acute subcapital fracture of the   left hip.    Dictation Site: 1          Verified By: DAVID A. Martinique, M.D., MD     Assessment/Admission Diagnosis 81 year old female s/p mechanical fall with Left hip fracture and right ankle pain (XR pending)    Plan Due to increaseed risk of bleeding, continue to hold pradaxa and delay surgery while PTT normalizes Case discussed with attending anesthesiologist and will need to wait 72 hours after most recent dose to consider spinal anesthesia but will still consider GA tomorrow if coag profile is acceptable for surgery and she is not deemed unsafe for GA. Patient made aware that surgery (left hip hemiarthroplasty) will be either tomorrow  or the  first part of next week due to these concerns. Informed consent obtained and questions answered with the patient and her husband. Continue traction to LLE XR of R ankle pending, will be reviewed when available DVT ppx - previously on pradaxa, holding currently, SCD's now medicine following for co-managment   Electronic Signatures: Margaret Pyle (MD)  (Signed [phone] 09:07)  Authored: CHIEF COMPLAINT and HISTORY, PAST MEDICAL/SURGIAL HISTORY, ALLERGIES, HOME MEDICATIONS, FAMILY AND SOCIAL HISTORY, REVIEW OF SYSTEMS, PHYSICAL EXAM, LABS, Radiology, ASSESSMENT AND PLAN   Last Updated: 04-May-13 09:07 by Margaret Pyle (MD)

## 2014-10-08 NOTE — Discharge Summary (Signed)
PATIENT NAME:  Vickie Sawyer, Vickie Sawyer MR#:  [id number] DATE OF BIRTH:  25-Oct-1922  DATE OF ADMISSION:  10/30/2011 DATE OF DISCHARGE:  11/05/2011  DISCHARGE DIAGNOSES:  1. Pneumonia, hospital acquired, likely resistant organism that has failed to respond to initial treatment. 2. Atrial fibrillation.  3. Chronic obstructive pulmonary disease with exacerbation.  4. Acute respiratory failure.  5. Congestive heart failure, per cardiology. 6. Recent hip fracture.   DISCHARGE MEDICATIONS:  1. Carvedilol 6.25 mg twice a day. 2. Senna one twice a day. 3. Sliding scale insulin twice a day. 4. Tylenol 1000 mg p.r.n.  5. Digoxin 0.125 mg daily.  6. Vitamin D 2000 international units daily.  7. Zosyn every 8 hours IV. 8. Vancomycin 750 mg every 24 hours IV.  9. Pradaxa 75 mg p.o. twice a day. 10. Prednisone taper 40 mg for five days, then 20 mg for five days, and then stop.  11. Torsemide 10 mg daily.   HISTORY AND PHYSICAL: Please see the detailed history and physical done on admission.   HOSPITAL COURSE: The patient was admitted not long after admission for a hip fracture. She had been treated with antibiotics and prednisone for possible pneumonia and chronic obstructive pulmonary disease flare. However, she worsened and required more and more oxygen and was admitted on the high flow nasal cannula. Her antibiotics were switched to vancomycin and Zosyn, as noted, for aspiration and/or resistant organism. She did slowly improve, although her antibiotics were stopped by the weekend, when I was off for a couple of days, and did slow her improvement somewhat. Her white count is now coming down. She is back to 2 liters nasal cannula and feeling well. Given the fact this was possible aspiration and/or resistant organism she will need another week of IV antibiotics. She has had a PICC line in that is being set up at this point. Tapering her steroids as noted. She will follow-up with Dr. Lady GaryFath who saw her in  consultation for further evaluation of LV function going forward. White count down to 15,000 today as noted. GFR is back up to 44. It had gone down in the 30s with more aggressive diuresis. She will need further physical therapy and occupational therapy for the hip fracture as well as improving her stamina getting over the severe pneumonia, etc. Notably V/Q scan was low probability that was done on admission as well as lower extremity Doppler's.  TIME SPENT: It took approximately 35 minutes to do the discharge tasks today. ____________________________ Vickie AmslerMarshall W. Dareen PianoAnderson, Vickie Sawyer mwa:slb D: 11/05/2011 07:00:00 ET T: 11/05/2011 07:08:19 ET JOB#: [id number]  cc: Vickie AmslerMarshall W. Dareen PianoAnderson, Vickie Sawyer, <Dictator> Vickie SchullerMichael J. Menz, Vickie Sawyer Lauro RegulusMARSHALL W [place]qu[place] Vickie Sawyer ELECTRONICALLY SIGNED 11/05/2011 8:56

## 2014-10-08 NOTE — Consult Note (Signed)
Chief Complaint:   Subjective/Chief Complaint pain persisis   VITAL SIGNS/ANCILLARY NOTES: **Vital Signs.:   03-Feb-13 04:33   Vital Signs Type Routine   Temperature Temperature (F) 98.4   Celsius 36.8   Temperature Source oral   Pulse Pulse 95   Pulse source per Dinamap   Respirations Respirations 20   Systolic BP Systolic BP 413   Diastolic BP (mmHg) Diastolic BP (mmHg) 93   Mean BP 111   BP Source Dinamap   Pulse Ox % Pulse Ox % 95   Pulse Ox Activity Level  At rest   Oxygen Delivery Room Air/ 21 %    07:22   Vital Signs Type POCT   Nurse Fingerstick (mg/dL) FSBS (fasting range 65-99 mg/dL) 165   Comments/Interventions  Nurse Notified    08:18   Vital Signs Type Routine   Temperature Temperature (F) 98.1   Celsius 36.7   Temperature Source oral   Pulse Pulse 97   Pulse source per Dinamap   Respirations Respirations 22   Systolic BP Systolic BP 244   Diastolic BP (mmHg) Diastolic BP (mmHg) 85   Mean BP 102   BP Source Dinamap   Pulse Ox % Pulse Ox % 94   Pulse Ox Activity Level  At rest   Oxygen Delivery Room Air/ 21 %  *Intake and Output.:   03-Feb-13 04:33   Grand Totals Intake:  670 Output:      Net:  670 24 Hr.:  1845   Oral Intake      In:  120   IV (Primary)      In:  500   IV (Secondary)      In:  50   Weight Type daily   Weight Method Bed   Current Weight (lbs) (lbs) 140.6   Current Weight (kg) (kg) 63.7   Height (ft) (feet) 5   Height (in) (in) 5   Height (cm) centimeters 165.1   BSA (m2) 1.7   BMI (kg/m2) 23.3    Shift 07:00   Grand Totals Intake:  670 Output:      Net:  670 24 Hr.:  0102   Oral Intake      In:  120   IV (Primary)      In:  500   IV (Secondary)      In:  50   Length of Stay Totals Intake:  1845 Output:      Net:  7253    Daily 07:00   Grand Totals Intake:  1845 Output:      Net:  1845 24 Hr.:  6644   Oral Intake      In:  720   IV (Primary)      In:  1025   IV (Secondary)      In:  100   Length of Stay Totals  Intake:  1845 Output:      Net:  0347    08:20   Grand Totals Intake:   Output:      Net:   24 Hr.:     Unmeasured Intake  no tray in room   Brief Assessment:   Cardiac Irregular    Respiratory normal resp effort  crackles  bilateral crackles    Gastrointestinal details abnormal Tender....    Tenderness RUQ  Difuse    Additional Physical Exam pt is more tender right upper quadrent of abdomen but less tender in the left side of abdomen   Routine UA:  02-Feb-13  06:10    Color (UA) Yellow   Clarity (UA) Hazy   Bilirubin (UA) Negative   Ketones (UA) Negative   Specific Gravity (UA) 1.016   Blood (UA) 1+   pH (UA) 7.0   Nitrite (UA) Negative   Leukocyte Esterase (UA) Negative   RBC (UA) 46 /HPF   WBC (UA) 1 /HPF   Mucous (UA) PRESENT   Hyaline Cast (UA) 1 /LPF  Cardiac:  02-Feb-13 06:32    CK, Total 48   CPK-MB, Serum 1.8  Routine Chem:  02-Feb-13 06:32    Glucose, Serum 183   BUN 19   Creatinine (comp) 1.34   Sodium, Serum 137   Potassium, Serum 4.1   Chloride, Serum 100   CO2, Serum 26   Calcium (Total), Serum 10.0  Hepatic:  02-Feb-13 06:32    Bilirubin, Total 1.3   Alkaline Phosphatase 73   SGPT (ALT) 24   SGOT (AST) 25   Total Protein, Serum 8.0   Albumin, Serum 4.2  Routine Chem:  02-Feb-13 06:32    Osmolality (calc) 281   eGFR (African American) 48   eGFR (Non-African American) 40   Anion Gap 11  Routine Hem:  02-Feb-13 06:32    WBC (CBC) 19.9   RBC (CBC) 4.47   Hemoglobin (CBC) 14.0   Hematocrit (CBC) 41.5   Platelet Count (CBC) 305   MCV 93   MCH 31.4   MCHC 33.8   RDW 14.0  Cardiac:  02-Feb-13 06:32    Troponin I < 0.02  Hepatic:  02-Feb-13 06:32    Bilirubin, Direct 0.2  Lab:  02-Feb-13 10:10    Lactic Acid, Cardiopulmonary 3.1  Cardiac:  02-Feb-13 14:50    CK, Total 52   CPK-MB, Serum 2.1   Troponin I 0.03  Routine Hem:  02-Feb-13 14:50    Erythrocyte Sed Rate 13  Cardiac:  02-Feb-13 23:27    CK, Total 67   CPK-MB,  Serum 1.9   Troponin I 0.06  03-Feb-13 05:29    CPK-MB, Serum 1.6  Routine Chem:  03-Feb-13 05:29    Glucose, Serum 148   BUN 22   Creatinine (comp) 1.28   Sodium, Serum 137   Potassium, Serum 3.5   Chloride, Serum 101   CO2, Serum 26   Calcium (Total), Serum 9.3  Hepatic:  03-Feb-13 05:29    Bilirubin, Total 2.4   Alkaline Phosphatase 64   SGPT (ALT) 24   SGOT (AST) 31   Total Protein, Serum 6.4   Albumin, Serum 3.3  Routine Chem:  03-Feb-13 05:29    Osmolality (calc) 280   eGFR (African American) 51   eGFR (Non-African American) 42   Anion Gap 10  Routine Hem:  03-Feb-13 05:29    WBC (CBC) 31.8   RBC (CBC) 4.45   Hemoglobin (CBC) 13.9   Hematocrit (CBC) 41.4   Platelet Count (CBC) 265   MCV 93   MCH 31.3   MCHC 33.6   RDW 13.8  Cardiac:  03-Feb-13 05:29    Troponin I 0.04  Hepatic:  03-Feb-13 05:29    Bilirubin, Direct 0.5  Routine Hem:  03-Feb-13 05:29    Neutrophil % 94.1   Lymphocyte % 5.0   Monocyte % 0.8   Eosinophil % 0.0   Basophil % 0.1   Neutrophil # 29.9   Lymphocyte # 1.6   Monocyte # 0.3   Eosinophil # 0.0   Basophil # 0.0  Routine Coag:  03-Feb-13 09:24  Prothrombin 17.0   INR 1.3   Radiology Results: XRay:    02-Feb-13 07:56, Chest PA and Lateral   Chest PA and Lateral    REASON FOR EXAM:    altered mental status, WBC 19k  COMMENTS:       PROCEDURE: DXR - DXR CHEST PA (OR AP) AND LATERAL  - Jul 19 2011  7:56AM     RESULT: Comparison: 01/06/2011    Findings:     PA and lateral chest radiographs are provided. There is mild bilateral   interstitial thickening predominantly at the lung bases likely reflecting   chronic interstitial lung disease. There is no focal parenchymal opacity,   pleural effusion, or pneumothorax. The heart and mediastinum are   unremarkable.  The osseous structures are unremarkable.    IMPRESSION:     No acute disease of the chest.          Verified By: Jennette Banker, M.D., MD  Lab:     02-Feb-13 10:10, Lactic Acid, Cardiopulmonary   Lactic Acid, Cardiopulmonary 3.1   Result(s) reported on 19 Jul 2011 at 10:15AM.  CT:    02-Feb-13 09:22, CT Head Without Contrast   CT Head Without Contrast    REASON FOR EXAM:    hx of CVA's/afib, altered mental status, diaphoretic  COMMENTS:       PROCEDURE: CT  - CT HEAD WITHOUT CONTRAST  - Jul 19 2011  9:22AM     RESULT: Comparison:  01/06/2011    Technique: Multiple axial images from the foramen magnum to the vertex   were obtained without IV contrast.    Findings:      There is no evidence of mass effect, midline shift, or extra-axial fluid   collections.  There is no evidence of a space-occupying lesion or   intracranial hemorrhage. There is no evidence of a cortical-based area of     acute infarction. There is an old left subinsular lacunar infarct. There   is an old left cerebellar infarct. There is generalized cerebral atrophy.   There is periventricular white matter low attenuation likely secondary to   microangiopathy.    The ventricles and sulci are appropriate for the patient's age. The basal   cisterns are patent.    Visualized portions of the orbits are unremarkable. The visualized   portions of the paranasal sinuses and mastoid air cells are unremarkable.   Cerebrovascular atherosclerotic calcifications are noted.    The osseous structures are unremarkable.    IMPRESSION:    No acute intracranial process.          Verified By: Jennette Banker, M.D., MD    [phone] 11:13, CT Abdomen and Pelvis Without Contrast   CT Abdomen and Pelvis Without Contrast    REASON FOR EXAM:    (1) hematuria/abd distension; (2) hematuria/abd   distension  COMMENTS:       PROCEDURE: CT  - CT ABDOMEN AND PELVIS W0  - Jul 19 2011 11:13AM     RESULT: Indication: Hematuria, abdominal distention    Comparison: None    Technique: Multiple axial images from the lung bases to the symphysis   pubis were obtained without oral and  without intravenous contrast.    Findings:    There is bibasilar fibrosis. There is coronary artery atherosclerosis.    No renal, ureteral, or bladdercalculi. No obstructive uropathy. No   perinephric stranding is seen. The kidneys are symmetric in size without   evidence for  exophytic mass. There is air within the bladder which may be   secondary to recent instrumentation, in the absence of recent   instrumentation the appearance is concerning for infection..    There is a small amount of perihepatic ascites. The liver demonstrates no   focal abnormality. There are cholelithiasis. There is gallbladder wall   thickening and a trace amount of pericholecystic fluid. There is mild   haziness in the surrounding pericholecystic fat. The overall appearance   is concerning for acute cholecystitis. The spleen demonstrates no focal   abnormality. The adrenal glands and pancreas are normal.     The unopacified stomach, duodenum, small intestine, and large intestine   are unremarkable, but evaluation is limited by lack of oral contrast.   There is a normal caliber appendix in the right lower quadrant without   periappendiceal inflammatory changes. There is no pneumoperitoneum,   pneumatosis, or portal venous gas.  There is no lymphadenopathy.     The abdominal aorta is normal in caliber with atherosclerosis.    There is lumbar spine spondylosis. There is a proximal right femoral   intramedullary rod with a interlocking cannulated femoral neck screw.    IMPRESSION:     1.  There are cholelithiasis. There is gallbladder wall thickening and a   trace amount of pericholecystic fluid. There is mild haziness in the     surrounding pericholecystic fat. The overall appearance is concerning for   acute cholecystitis.    2.  There is air within the bladder which may be secondary to recent   instrumentation, in the absence of recent instrumentation the appearance   is concerning for  infection..          Verified By: Jennette Banker, M.D., MD   Assessment/Plan:  Assessment/Plan:   Assessment this pt has acute gangrenous gall bladder because of predexa could not do it earlier .it is about 24 hours now since she took predexa and we have to do her.Will give 2 units of FFP and type and cross match for 2 units    Plan lap choly will attempt to remove it and also will try to do cholangiogram.i think reason for increased bilirubin is probably due to gangrenous gall bladder as the alkaline phosphatase is normal.    There is possibility that i only do cholysystostomy tube if she bleeds alot to get by the time   Electronic Signatures: Vella Kohler (MD)  (Signed 03-Feb-13 10:25)  Authored: Chief Complaint, VITAL SIGNS/ANCILLARY NOTES, Brief Assessment, Lab Results, Radiology Results, Assessment/Plan   Last Updated: 03-Feb-13 10:25 by Vella Kohler (MD)

## 2014-10-08 NOTE — Discharge Summary (Signed)
PATIENT NAME:  Vickie LennertRUSS, Dedra C MR#:  [id number] DATE OF BIRTH:  [birth year]  DATE OF ADMISSION:  07/19/2011 DATE OF DISCHARGE:  07/25/2011  DISCHARGE DIAGNOSES:  1. Cholecystitis with gangrenous gallbladder status post cholecystectomy.  2. Encephalopathy secondary to age, history of dementia, as well as above.  3. Accelerated hypertension secondary to pain, now improved with change to carvedilol.  4. History of atrial fibrillation, Pradaxa held, will be restarted in a few days.  5. Type 2 diabetes on diet, sliding scale insulin while in the hospital which is now stable.   DISCHARGE MEDICATIONS:  1. Carvedilol 6.25 mg p.o. b.i.d.  2. Augmentin 875 mg p.o. b.i.d. x8 days.  3. Heparin 5000 units subcutaneous every 12 hours until Tuesday when Pradaxa is restarted.  4. Pradaxa 150 mg p.o. b.i.d. starting Tuesday.  5. Advair 250/50, 1 puff b.i.d.  6. Sliding scale insulin as ordered before meals and at bedtime until her blood sugars are clearly stabilized at the facility and can decrease to daily.  7. Tylox 5/325, 1 to 2 every four hours p.r.n. pain.   HISTORY AND PHYSICAL: Please see detailed history and physical done on admission.   HOSPITAL COURSE: Patient was admitted confused with leukocytosis of unclear cause originally. Blood cultures were initially negative. Repeat cultures had multiple species of streptococcus as well as possible contamination. Influenza was negative with nasal swab. Head CT was negative. Did have elevated lactic acid on admission. CT of her abdomen and pelvis showed cholelithiasis with gallbladder wall thickening, trace pericolic cystic fluid as well as air in the gallbladder. Surgery was then consulted. Cholecystectomy was done with drain placed. White count prior to that did climb to 31,800. She improved rapidly postoperative but was observed in Intensive Care Unit overnight given her degree of illness preop. White count came down to 11,100 yesterday's date. Bilirubin slowly  decreased 1.1 with upper limits normal 1.0 total and 0.3 with upper limits of normal 0.2 on 02/07. AST came down to normal as had been elevated. She did well. She is eating regular diet well. Switched to p.o. antibiotics was stable. She needed period of rehabilitation with physical therapy, occupational therapy given her weakness, etc. She will be watched closely. Medications as ordered.   TIME SPENT: Took approximately 35 minutes to do discharge tasks today.   ____________________________ Marya AmslerMarshall W. Dareen PianoAnderson, MD mwa:cms D: 07/25/2011 08:07:00 ET T: 07/25/2011 08:33:55 ET JOB#: [id number]  cc: Marya AmslerMarshall W. Dareen PianoAnderson, MD, <Dictator> Lauro RegulusMARSHALL W Early Ord MD ELECTRONICALLY SIGNED 07/28/2011 7:54

## 2014-10-08 NOTE — Consult Note (Signed)
Brief Consult Note: Diagnosis: acute cholysystitis.   Patient was seen by consultant.   Recommend further assessment or treatment.   Discussed with Attending MD.   Comments: This patient has acute cholysystitis but she is on pradaxa for atrial fibrilation .will hold the drug for 2 days and monitor the patient very closely .will hold medications now and she could stay on clear liquids till her coagulation gets straighten out.  Electronic Signatures: Jovita GammaHashmi, Iyani Dresner (MD)  (Signed [phone]-Feb-13 13:52)  Authored: Brief Consult Note   Last Updated: 02-Feb-13 13:52 by Jovita GammaHashmi, Ryonna Cimini (MD)
